# Patient Record
Sex: Female | Born: 1970
Health system: Southern US, Community
[De-identification: ages and names within clinical notes are randomized; demographics above are authoritative.]

## PROBLEM LIST (undated history)

## (undated) DIAGNOSIS — K219 Gastro-esophageal reflux disease without esophagitis: Secondary | ICD-10-CM

## (undated) DIAGNOSIS — M719 Bursopathy, unspecified: Secondary | ICD-10-CM

## (undated) DIAGNOSIS — R51 Headache: Secondary | ICD-10-CM

## (undated) DIAGNOSIS — K589 Irritable bowel syndrome without diarrhea: Secondary | ICD-10-CM

## (undated) DIAGNOSIS — J302 Other seasonal allergic rhinitis: Secondary | ICD-10-CM

## (undated) HISTORY — PX: TUBAL LIGATION: SHX77

## (undated) HISTORY — PX: OTHER SURGICAL HISTORY: SHX169

## (undated) HISTORY — PX: TONSILLECTOMY: SUR1361

---

## 1997-07-20 ENCOUNTER — Ambulatory Visit (HOSPITAL_COMMUNITY): Admission: RE | Admit: 1997-07-20 | Discharge: 1997-07-20 | Payer: Self-pay | Admitting: Obstetrics

## 1998-04-09 ENCOUNTER — Encounter: Admission: RE | Admit: 1998-04-09 | Discharge: 1998-04-09 | Payer: Self-pay | Admitting: *Deleted

## 1998-06-24 ENCOUNTER — Encounter: Payer: Self-pay | Admitting: Obstetrics & Gynecology

## 1998-06-24 ENCOUNTER — Inpatient Hospital Stay (HOSPITAL_COMMUNITY): Admission: AD | Admit: 1998-06-24 | Discharge: 1998-06-24 | Payer: Self-pay | Admitting: Obstetrics & Gynecology

## 1998-06-28 ENCOUNTER — Emergency Department (HOSPITAL_COMMUNITY): Admission: EM | Admit: 1998-06-28 | Discharge: 1998-06-28 | Payer: Self-pay | Admitting: Emergency Medicine

## 1998-09-10 ENCOUNTER — Emergency Department (HOSPITAL_COMMUNITY): Admission: EM | Admit: 1998-09-10 | Discharge: 1998-09-10 | Payer: Self-pay | Admitting: Emergency Medicine

## 1998-10-24 ENCOUNTER — Emergency Department (HOSPITAL_COMMUNITY): Admission: EM | Admit: 1998-10-24 | Discharge: 1998-10-24 | Payer: Self-pay | Admitting: Internal Medicine

## 1998-10-29 ENCOUNTER — Other Ambulatory Visit: Admission: RE | Admit: 1998-10-29 | Discharge: 1998-10-29 | Payer: Self-pay | Admitting: Obstetrics

## 1998-11-26 ENCOUNTER — Emergency Department (HOSPITAL_COMMUNITY): Admission: EM | Admit: 1998-11-26 | Discharge: 1998-11-27 | Payer: Self-pay | Admitting: *Deleted

## 1998-11-30 ENCOUNTER — Ambulatory Visit (HOSPITAL_COMMUNITY): Admission: RE | Admit: 1998-11-30 | Discharge: 1998-11-30 | Payer: Self-pay | Admitting: *Deleted

## 1999-01-18 ENCOUNTER — Inpatient Hospital Stay (HOSPITAL_COMMUNITY): Admission: AD | Admit: 1999-01-18 | Discharge: 1999-01-18 | Payer: Self-pay | Admitting: Obstetrics

## 1999-02-05 ENCOUNTER — Encounter: Payer: Self-pay | Admitting: Emergency Medicine

## 1999-02-05 ENCOUNTER — Emergency Department (HOSPITAL_COMMUNITY): Admission: EM | Admit: 1999-02-05 | Discharge: 1999-02-05 | Payer: Self-pay | Admitting: Emergency Medicine

## 1999-03-08 ENCOUNTER — Inpatient Hospital Stay (HOSPITAL_COMMUNITY): Admission: AD | Admit: 1999-03-08 | Discharge: 1999-03-08 | Payer: Self-pay | Admitting: Obstetrics

## 1999-03-09 ENCOUNTER — Emergency Department (HOSPITAL_COMMUNITY): Admission: EM | Admit: 1999-03-09 | Discharge: 1999-03-09 | Payer: Self-pay

## 1999-04-24 ENCOUNTER — Ambulatory Visit (HOSPITAL_COMMUNITY): Admission: RE | Admit: 1999-04-24 | Discharge: 1999-04-24 | Payer: Self-pay | Admitting: Neurology

## 1999-04-24 ENCOUNTER — Encounter: Payer: Self-pay | Admitting: Neurology

## 1999-08-18 ENCOUNTER — Inpatient Hospital Stay (HOSPITAL_COMMUNITY): Admission: AD | Admit: 1999-08-18 | Discharge: 1999-08-18 | Payer: Self-pay | Admitting: Obstetrics

## 1999-10-11 ENCOUNTER — Inpatient Hospital Stay (HOSPITAL_COMMUNITY): Admission: AD | Admit: 1999-10-11 | Discharge: 1999-10-11 | Payer: Self-pay | Admitting: Obstetrics

## 2000-01-09 ENCOUNTER — Inpatient Hospital Stay (HOSPITAL_COMMUNITY): Admission: AD | Admit: 2000-01-09 | Discharge: 2000-01-12 | Payer: Self-pay | Admitting: Obstetrics

## 2000-01-18 ENCOUNTER — Inpatient Hospital Stay (HOSPITAL_COMMUNITY): Admission: AD | Admit: 2000-01-18 | Discharge: 2000-01-18 | Payer: Self-pay

## 2000-08-11 ENCOUNTER — Other Ambulatory Visit: Admission: RE | Admit: 2000-08-11 | Discharge: 2000-08-11 | Payer: Self-pay | Admitting: Obstetrics

## 2000-08-19 ENCOUNTER — Emergency Department (HOSPITAL_COMMUNITY): Admission: EM | Admit: 2000-08-19 | Discharge: 2000-08-19 | Payer: Self-pay | Admitting: *Deleted

## 2001-10-14 ENCOUNTER — Ambulatory Visit (HOSPITAL_COMMUNITY): Admission: RE | Admit: 2001-10-14 | Discharge: 2001-10-14 | Payer: Self-pay | Admitting: Obstetrics

## 2001-10-14 ENCOUNTER — Encounter: Payer: Self-pay | Admitting: Obstetrics

## 2001-10-28 ENCOUNTER — Inpatient Hospital Stay (HOSPITAL_COMMUNITY): Admission: AD | Admit: 2001-10-28 | Discharge: 2001-10-28 | Payer: Self-pay | Admitting: Obstetrics

## 2001-11-09 ENCOUNTER — Inpatient Hospital Stay (HOSPITAL_COMMUNITY): Admission: AD | Admit: 2001-11-09 | Discharge: 2001-11-09 | Payer: Self-pay | Admitting: Obstetrics

## 2001-11-16 ENCOUNTER — Ambulatory Visit (HOSPITAL_COMMUNITY): Admission: RE | Admit: 2001-11-16 | Discharge: 2001-11-16 | Payer: Self-pay | Admitting: Obstetrics

## 2001-11-16 ENCOUNTER — Encounter: Payer: Self-pay | Admitting: Obstetrics

## 2001-11-18 ENCOUNTER — Inpatient Hospital Stay (HOSPITAL_COMMUNITY): Admission: AD | Admit: 2001-11-18 | Discharge: 2001-11-20 | Payer: Self-pay | Admitting: Obstetrics

## 2001-11-21 ENCOUNTER — Inpatient Hospital Stay (HOSPITAL_COMMUNITY): Admission: AD | Admit: 2001-11-21 | Discharge: 2001-11-21 | Payer: Self-pay | Admitting: Obstetrics

## 2001-11-22 ENCOUNTER — Inpatient Hospital Stay (HOSPITAL_COMMUNITY): Admission: AD | Admit: 2001-11-22 | Discharge: 2001-11-22 | Payer: Self-pay | Admitting: Obstetrics

## 2001-11-24 ENCOUNTER — Inpatient Hospital Stay (HOSPITAL_COMMUNITY): Admission: AD | Admit: 2001-11-24 | Discharge: 2001-11-24 | Payer: Self-pay | Admitting: Obstetrics

## 2001-11-28 ENCOUNTER — Inpatient Hospital Stay (HOSPITAL_COMMUNITY): Admission: AD | Admit: 2001-11-28 | Discharge: 2001-11-30 | Payer: Self-pay | Admitting: Obstetrics

## 2001-12-03 ENCOUNTER — Inpatient Hospital Stay (HOSPITAL_COMMUNITY): Admission: AD | Admit: 2001-12-03 | Discharge: 2001-12-05 | Payer: Self-pay | Admitting: Obstetrics

## 2002-01-12 ENCOUNTER — Inpatient Hospital Stay (HOSPITAL_COMMUNITY): Admission: RE | Admit: 2002-01-12 | Discharge: 2002-01-17 | Payer: Self-pay | Admitting: Obstetrics

## 2002-01-12 ENCOUNTER — Encounter: Payer: Self-pay | Admitting: Obstetrics

## 2002-01-12 ENCOUNTER — Encounter (INDEPENDENT_AMBULATORY_CARE_PROVIDER_SITE_OTHER): Payer: Self-pay | Admitting: *Deleted

## 2002-04-14 ENCOUNTER — Emergency Department (HOSPITAL_COMMUNITY): Admission: EM | Admit: 2002-04-14 | Discharge: 2002-04-14 | Payer: Self-pay | Admitting: Emergency Medicine

## 2002-04-14 ENCOUNTER — Encounter: Payer: Self-pay | Admitting: Emergency Medicine

## 2003-12-20 ENCOUNTER — Inpatient Hospital Stay (HOSPITAL_COMMUNITY): Admission: AD | Admit: 2003-12-20 | Discharge: 2003-12-20 | Payer: Self-pay | Admitting: Obstetrics

## 2003-12-22 ENCOUNTER — Inpatient Hospital Stay (HOSPITAL_COMMUNITY): Admission: AD | Admit: 2003-12-22 | Discharge: 2003-12-22 | Payer: Self-pay | Admitting: Obstetrics

## 2004-06-24 ENCOUNTER — Inpatient Hospital Stay (HOSPITAL_COMMUNITY): Admission: AD | Admit: 2004-06-24 | Discharge: 2004-06-24 | Payer: Self-pay | Admitting: Obstetrics

## 2004-07-05 ENCOUNTER — Inpatient Hospital Stay (HOSPITAL_COMMUNITY): Admission: AD | Admit: 2004-07-05 | Discharge: 2004-07-05 | Payer: Self-pay | Admitting: Obstetrics

## 2004-07-08 ENCOUNTER — Inpatient Hospital Stay (HOSPITAL_COMMUNITY): Admission: AD | Admit: 2004-07-08 | Discharge: 2004-07-08 | Payer: Self-pay | Admitting: Obstetrics

## 2004-07-13 ENCOUNTER — Inpatient Hospital Stay (HOSPITAL_COMMUNITY): Admission: AD | Admit: 2004-07-13 | Discharge: 2004-07-13 | Payer: Self-pay | Admitting: Obstetrics

## 2004-07-15 ENCOUNTER — Encounter (HOSPITAL_COMMUNITY): Admission: RE | Admit: 2004-07-15 | Discharge: 2004-07-15 | Payer: Self-pay | Admitting: Obstetrics

## 2004-07-22 ENCOUNTER — Inpatient Hospital Stay (HOSPITAL_COMMUNITY): Admission: AD | Admit: 2004-07-22 | Discharge: 2004-08-10 | Payer: Self-pay | Admitting: Obstetrics

## 2004-08-13 ENCOUNTER — Inpatient Hospital Stay (HOSPITAL_COMMUNITY): Admission: AD | Admit: 2004-08-13 | Discharge: 2004-08-13 | Payer: Self-pay | Admitting: Obstetrics

## 2004-09-10 ENCOUNTER — Inpatient Hospital Stay (HOSPITAL_COMMUNITY): Admission: AD | Admit: 2004-09-10 | Discharge: 2004-09-10 | Payer: Self-pay | Admitting: Obstetrics

## 2004-09-27 ENCOUNTER — Inpatient Hospital Stay (HOSPITAL_COMMUNITY): Admission: AD | Admit: 2004-09-27 | Discharge: 2004-09-29 | Payer: Self-pay | Admitting: Obstetrics

## 2004-10-07 ENCOUNTER — Encounter (INDEPENDENT_AMBULATORY_CARE_PROVIDER_SITE_OTHER): Payer: Self-pay | Admitting: *Deleted

## 2004-10-07 ENCOUNTER — Inpatient Hospital Stay (HOSPITAL_COMMUNITY): Admission: AD | Admit: 2004-10-07 | Discharge: 2004-10-10 | Payer: Self-pay | Admitting: Obstetrics

## 2004-10-18 ENCOUNTER — Inpatient Hospital Stay (HOSPITAL_COMMUNITY): Admission: AD | Admit: 2004-10-18 | Discharge: 2004-10-18 | Payer: Self-pay | Admitting: Obstetrics

## 2004-12-20 ENCOUNTER — Inpatient Hospital Stay (HOSPITAL_COMMUNITY): Admission: AD | Admit: 2004-12-20 | Discharge: 2004-12-20 | Payer: Self-pay | Admitting: Obstetrics

## 2006-07-24 ENCOUNTER — Emergency Department (HOSPITAL_COMMUNITY): Admission: EM | Admit: 2006-07-24 | Discharge: 2006-07-24 | Payer: Self-pay | Admitting: Emergency Medicine

## 2006-09-24 ENCOUNTER — Ambulatory Visit (HOSPITAL_COMMUNITY): Admission: RE | Admit: 2006-09-24 | Discharge: 2006-09-24 | Payer: Self-pay | Admitting: Obstetrics

## 2007-03-19 ENCOUNTER — Encounter (INDEPENDENT_AMBULATORY_CARE_PROVIDER_SITE_OTHER): Payer: Self-pay | Admitting: Obstetrics

## 2007-03-19 ENCOUNTER — Ambulatory Visit (HOSPITAL_COMMUNITY): Admission: RE | Admit: 2007-03-19 | Discharge: 2007-03-19 | Payer: Self-pay | Admitting: Obstetrics

## 2007-12-08 ENCOUNTER — Emergency Department (HOSPITAL_COMMUNITY): Admission: EM | Admit: 2007-12-08 | Discharge: 2007-12-08 | Payer: Self-pay | Admitting: Emergency Medicine

## 2008-02-10 ENCOUNTER — Emergency Department (HOSPITAL_COMMUNITY): Admission: EM | Admit: 2008-02-10 | Discharge: 2008-02-10 | Payer: Self-pay | Admitting: Family Medicine

## 2008-03-14 ENCOUNTER — Emergency Department (HOSPITAL_COMMUNITY): Admission: EM | Admit: 2008-03-14 | Discharge: 2008-03-14 | Payer: Self-pay | Admitting: Family Medicine

## 2008-11-19 ENCOUNTER — Emergency Department (HOSPITAL_COMMUNITY): Admission: EM | Admit: 2008-11-19 | Discharge: 2008-11-20 | Payer: Self-pay | Admitting: Emergency Medicine

## 2008-12-15 ENCOUNTER — Emergency Department (HOSPITAL_COMMUNITY): Admission: EM | Admit: 2008-12-15 | Discharge: 2008-12-15 | Payer: Self-pay | Admitting: Emergency Medicine

## 2009-05-16 ENCOUNTER — Emergency Department (HOSPITAL_COMMUNITY): Admission: EM | Admit: 2009-05-16 | Discharge: 2009-05-16 | Payer: Self-pay | Admitting: Emergency Medicine

## 2009-06-16 ENCOUNTER — Emergency Department (HOSPITAL_COMMUNITY): Admission: EM | Admit: 2009-06-16 | Discharge: 2009-06-16 | Payer: Self-pay | Admitting: Family Medicine

## 2009-06-19 ENCOUNTER — Emergency Department (HOSPITAL_COMMUNITY): Admission: EM | Admit: 2009-06-19 | Discharge: 2009-06-19 | Payer: Self-pay | Admitting: Emergency Medicine

## 2009-07-02 ENCOUNTER — Emergency Department (HOSPITAL_COMMUNITY): Admission: EM | Admit: 2009-07-02 | Discharge: 2009-07-02 | Payer: Self-pay | Admitting: Emergency Medicine

## 2009-07-16 ENCOUNTER — Emergency Department (HOSPITAL_COMMUNITY): Admission: EM | Admit: 2009-07-16 | Discharge: 2009-07-16 | Payer: Self-pay | Admitting: Emergency Medicine

## 2009-09-09 ENCOUNTER — Emergency Department (HOSPITAL_COMMUNITY): Admission: EM | Admit: 2009-09-09 | Discharge: 2009-09-09 | Payer: Self-pay | Admitting: Emergency Medicine

## 2009-10-19 ENCOUNTER — Emergency Department (HOSPITAL_COMMUNITY): Admission: EM | Admit: 2009-10-19 | Discharge: 2009-10-19 | Payer: Self-pay | Admitting: Emergency Medicine

## 2009-11-09 ENCOUNTER — Ambulatory Visit (HOSPITAL_COMMUNITY): Admission: RE | Admit: 2009-11-09 | Discharge: 2009-11-09 | Payer: Self-pay | Admitting: Obstetrics

## 2009-12-06 ENCOUNTER — Ambulatory Visit (HOSPITAL_BASED_OUTPATIENT_CLINIC_OR_DEPARTMENT_OTHER): Admission: RE | Admit: 2009-12-06 | Discharge: 2009-12-06 | Payer: Self-pay | Admitting: Urology

## 2010-05-26 ENCOUNTER — Encounter: Payer: Self-pay | Admitting: Obstetrics

## 2010-07-19 LAB — POCT PREGNANCY, URINE: Preg Test, Ur: NEGATIVE

## 2010-07-19 LAB — POCT HEMOGLOBIN-HEMACUE: Hemoglobin: 13.1 g/dL (ref 12.0–15.0)

## 2010-07-21 LAB — URINALYSIS, ROUTINE W REFLEX MICROSCOPIC
Bilirubin Urine: NEGATIVE
Glucose, UA: NEGATIVE mg/dL
Ketones, ur: NEGATIVE mg/dL
Leukocytes, UA: NEGATIVE
Nitrite: NEGATIVE
Protein, ur: NEGATIVE mg/dL
Specific Gravity, Urine: 1.019 (ref 1.005–1.030)
Urobilinogen, UA: 0.2 mg/dL (ref 0.0–1.0)
pH: 5 (ref 5.0–8.0)

## 2010-07-21 LAB — URINE MICROSCOPIC-ADD ON

## 2010-07-21 LAB — POCT I-STAT, CHEM 8
BUN: 6 mg/dL (ref 6–23)
Calcium, Ion: 1.17 mmol/L (ref 1.12–1.32)
Chloride: 107 mEq/L (ref 96–112)
Creatinine, Ser: 1 mg/dL (ref 0.4–1.2)
Glucose, Bld: 95 mg/dL (ref 70–99)
HCT: 39 % (ref 36.0–46.0)
Hemoglobin: 13.3 g/dL (ref 12.0–15.0)
Potassium: 3.6 mEq/L (ref 3.5–5.1)
Sodium: 138 mEq/L (ref 135–145)
TCO2: 22 mmol/L (ref 0–100)

## 2010-07-21 LAB — DIFFERENTIAL
Basophils Absolute: 0 10*3/uL (ref 0.0–0.1)
Basophils Relative: 1 % (ref 0–1)
Eosinophils Absolute: 0.3 10*3/uL (ref 0.0–0.7)
Eosinophils Relative: 6 % — ABNORMAL HIGH (ref 0–5)
Lymphocytes Relative: 39 % (ref 12–46)
Lymphs Abs: 1.6 10*3/uL (ref 0.7–4.0)
Monocytes Absolute: 0.5 10*3/uL (ref 0.1–1.0)
Monocytes Relative: 11 % (ref 3–12)
Neutro Abs: 1.8 10*3/uL (ref 1.7–7.7)
Neutrophils Relative %: 43 % (ref 43–77)

## 2010-07-21 LAB — CBC
HCT: 38.6 % (ref 36.0–46.0)
Hemoglobin: 12.9 g/dL (ref 12.0–15.0)
MCHC: 33.4 g/dL (ref 30.0–36.0)
MCV: 94.4 fL (ref 78.0–100.0)
Platelets: 268 10*3/uL (ref 150–400)
RBC: 4.09 MIL/uL (ref 3.87–5.11)
RDW: 12.9 % (ref 11.5–15.5)
WBC: 4.2 10*3/uL (ref 4.0–10.5)

## 2010-07-21 LAB — WET PREP, GENITAL
Trich, Wet Prep: NONE SEEN
Yeast Wet Prep HPF POC: NONE SEEN

## 2010-07-21 LAB — RAPID STREP SCREEN (MED CTR MEBANE ONLY): Streptococcus, Group A Screen (Direct): NEGATIVE

## 2010-07-21 LAB — POCT PREGNANCY, URINE: Preg Test, Ur: NEGATIVE

## 2010-07-23 LAB — POCT RAPID STREP A (OFFICE): Streptococcus, Group A Screen (Direct): POSITIVE — AB

## 2010-07-24 LAB — CBC
HCT: 38.1 % (ref 36.0–46.0)
Hemoglobin: 13.2 g/dL (ref 12.0–15.0)
MCHC: 34.6 g/dL (ref 30.0–36.0)
MCV: 93.7 fL (ref 78.0–100.0)
Platelets: 228 10*3/uL (ref 150–400)
RBC: 4.06 MIL/uL (ref 3.87–5.11)
RDW: 13.6 % (ref 11.5–15.5)
WBC: 4.5 10*3/uL (ref 4.0–10.5)

## 2010-07-24 LAB — D-DIMER, QUANTITATIVE: D-Dimer, Quant: 0.31 ug/mL-FEU (ref 0.00–0.48)

## 2010-07-24 LAB — COMPREHENSIVE METABOLIC PANEL
ALT: 21 U/L (ref 0–35)
AST: 17 U/L (ref 0–37)
Albumin: 4 g/dL (ref 3.5–5.2)
Alkaline Phosphatase: 62 U/L (ref 39–117)
BUN: 8 mg/dL (ref 6–23)
CO2: 24 mEq/L (ref 19–32)
Calcium: 9.6 mg/dL (ref 8.4–10.5)
Chloride: 108 mEq/L (ref 96–112)
Creatinine, Ser: 0.83 mg/dL (ref 0.4–1.2)
GFR calc Af Amer: >60 mL/min (ref >60)
GFR calc non Af Amer: >60 mL/min (ref >60)
Glucose, Bld: 91 mg/dL (ref 70–99)
Potassium: 3.9 mEq/L (ref 3.5–5.1)
Sodium: 137 mEq/L (ref 135–145)
Total Bilirubin: 0.3 mg/dL (ref 0.3–1.2)
Total Protein: 7.6 g/dL (ref 6.0–8.3)

## 2010-07-24 LAB — DIFFERENTIAL
Basophils Absolute: 0 10*3/uL (ref 0.0–0.1)
Basophils Relative: 1 % (ref 0–1)
Eosinophils Absolute: 0.3 10*3/uL (ref 0.0–0.7)
Eosinophils Relative: 6 % — ABNORMAL HIGH (ref 0–5)
Lymphocytes Relative: 45 % (ref 12–46)
Lymphs Abs: 2 10*3/uL (ref 0.7–4.0)
Monocytes Absolute: 0.5 10*3/uL (ref 0.1–1.0)
Monocytes Relative: 11 % (ref 3–12)
Neutro Abs: 1.6 10*3/uL — ABNORMAL LOW (ref 1.7–7.7)
Neutrophils Relative %: 36 % — ABNORMAL LOW (ref 43–77)

## 2010-07-24 LAB — POCT CARDIAC MARKERS
CKMB, poc: <1 ng/mL — ABNORMAL LOW (ref 1.0–8.0)
Myoglobin, poc: 63.5 ng/mL (ref 12–200)
Troponin i, poc: <0.05 ng/mL (ref 0.00–0.09)

## 2010-07-24 LAB — URINALYSIS, ROUTINE W REFLEX MICROSCOPIC
Bilirubin Urine: NEGATIVE
Glucose, UA: NEGATIVE mg/dL
Ketones, ur: NEGATIVE mg/dL
Nitrite: POSITIVE — AB
Protein, ur: 100 mg/dL — AB
Specific Gravity, Urine: 1.033 — ABNORMAL HIGH (ref 1.005–1.030)
Urobilinogen, UA: 0.2 mg/dL (ref 0.0–1.0)
pH: 6 (ref 5.0–8.0)

## 2010-07-24 LAB — URINE CULTURE: Colony Count: >=100000

## 2010-07-24 LAB — URINE MICROSCOPIC-ADD ON

## 2010-07-24 LAB — POCT PREGNANCY, URINE: Preg Test, Ur: NEGATIVE

## 2010-07-24 LAB — LIPASE, BLOOD: Lipase: 38 U/L (ref 11–59)

## 2010-07-28 LAB — POCT RAPID STREP A (OFFICE): Streptococcus, Group A Screen (Direct): POSITIVE — AB

## 2010-08-11 LAB — PREGNANCY, URINE: Preg Test, Ur: NEGATIVE

## 2010-08-11 LAB — COMPREHENSIVE METABOLIC PANEL
ALT: 18 U/L (ref 0–35)
AST: 21 U/L (ref 0–37)
Albumin: 4.1 g/dL (ref 3.5–5.2)
Alkaline Phosphatase: 60 U/L (ref 39–117)
BUN: 12 mg/dL (ref 6–23)
CO2: 21 mEq/L (ref 19–32)
Calcium: 9.9 mg/dL (ref 8.4–10.5)
Chloride: 108 mEq/L (ref 96–112)
Creatinine, Ser: 0.99 mg/dL (ref 0.4–1.2)
GFR calc Af Amer: >60 mL/min (ref >60)
GFR calc non Af Amer: >60 mL/min (ref >60)
Glucose, Bld: 95 mg/dL (ref 70–99)
Potassium: 3.4 mEq/L — ABNORMAL LOW (ref 3.5–5.1)
Sodium: 136 mEq/L (ref 135–145)
Total Bilirubin: 0.7 mg/dL (ref 0.3–1.2)
Total Protein: 7.9 g/dL (ref 6.0–8.3)

## 2010-08-11 LAB — CBC
HCT: 39.1 % (ref 36.0–46.0)
Hemoglobin: 13.5 g/dL (ref 12.0–15.0)
MCHC: 34.4 g/dL (ref 30.0–36.0)
MCV: 94.4 fL (ref 78.0–100.0)
Platelets: 265 10*3/uL (ref 150–400)
RBC: 4.14 MIL/uL (ref 3.87–5.11)
RDW: 12.5 % (ref 11.5–15.5)
WBC: 5.7 10*3/uL (ref 4.0–10.5)

## 2010-08-11 LAB — URINALYSIS, ROUTINE W REFLEX MICROSCOPIC
Bilirubin Urine: NEGATIVE
Glucose, UA: NEGATIVE mg/dL
Hgb urine dipstick: NEGATIVE
Ketones, ur: NEGATIVE mg/dL
Nitrite: NEGATIVE
Protein, ur: NEGATIVE mg/dL
Specific Gravity, Urine: 1.018 (ref 1.005–1.030)
Urobilinogen, UA: 1 mg/dL (ref 0.0–1.0)
pH: 6.5 (ref 5.0–8.0)

## 2010-08-11 LAB — DIFFERENTIAL
Basophils Absolute: 0.1 10*3/uL (ref 0.0–0.1)
Basophils Relative: 2 % — ABNORMAL HIGH (ref 0–1)
Eosinophils Absolute: 0.2 10*3/uL (ref 0.0–0.7)
Eosinophils Relative: 3 % (ref 0–5)
Lymphocytes Relative: 47 % — ABNORMAL HIGH (ref 12–46)
Lymphs Abs: 2.6 10*3/uL (ref 0.7–4.0)
Monocytes Absolute: 0.5 10*3/uL (ref 0.1–1.0)
Monocytes Relative: 9 % (ref 3–12)
Neutro Abs: 2.2 10*3/uL (ref 1.7–7.7)
Neutrophils Relative %: 39 % — ABNORMAL LOW (ref 43–77)

## 2010-09-17 NOTE — Op Note (Signed)
Samantha, Cuevas               ACCOUNT NO.:  [id number]   MEDICAL RECORD NO.:  [id number]          PATIENT TYPE:  AMB   LOCATION:  SDC                           [facility]  [facility]   PHYSICIAN:  Kathreen Cosier, M.D.DATE OF BIRTH:  25-Aug-1970   DATE OF PROCEDURE:  03/19/2007  DATE OF DISCHARGE:                               OPERATIVE REPORT   PREOPERATIVE DIAGNOSIS:  Dysfunctional uterine bleeding.   POSTOPERATIVE DIAGNOSIS:  Dysfunctional uterine bleeding.   ANESTHESIA:  General.   DESCRIPTION OF PROCEDURE:  Under general anesthesia with the patient in  the lithotomy position, the peritoneum and vagina were prepped and  draped.  The bladder was emptied with a straight catheter.  Bimanual  examination revealed the uterus top normal size.  Speculum was placed in  the vagina.  The cervix was injected with 10 mL of 1% lidocaine.  The  endocervix curetted and a small amount of tissue obtained.  Endometrial  cavity sounded to 9 cm.  Cervical length measured at 5 cm.  Cavity  length was 4 cm.  The cervix was dilated to #25 Shawnie Pons and the  hysteroscope was inserted.  The cavity was noted to be normal.  The  hysteroscope was removed and the sharp curettage done.  A small amount  of tissue obtained.  The NovaSure device was then inserted.  The cavity  width was 4.5 cm.  Cavity integrity passed and ablation performed for 2  minutes at 99 watts.  Fluid loss was 80 mL.  A repeat hysteroscopy done  showed total fluid deficit was 80 mL.  The patient tolerated the  procedure well and was taken to the recovery room in good condition.           ______________________________  Kathreen Cosier, M.D.     BAM/MEDQ  D:  03/19/2007  T:  03/20/2007  Job:  [id number]

## 2010-09-20 NOTE — Discharge Summary (Signed)
NAMEANTONAE, ZBIKOWSKI NO.:  [id number]   MEDICAL RECORD NO.:  [id number]          PATIENT TYPE:  INP   LOCATION:  9157                          [facility]   PHYSICIAN:  Kathreen Cosier, M.D.DATE OF BIRTH:  July 07, 1970   DATE OF ADMISSION:  07/22/2004  DATE OF DISCHARGE:  08/10/2004                                 DISCHARGE SUMMARY   HOSPITAL COURSE:  The patient is a 40 year old gravida 7 para 2-2-3-4, had  three C-sections in the past. Her EDC was October 27, 2004. She was admitted  with premature contractions and then started on Delalutin with this  pregnancy. On the day of admission, the cervix was shown by ultrasound to be  0.9 cm long. She had had irritability, and she was admitted for  betamethasone and magnesium sulfate. The patient was kept on magnesium  sulfate throughout her stay and she was on 2.5 g of magnesium per hour,  sometimes had to be upped to 3 g of magnesium sulfate per hour. GBS was  performed and that was negative. On April 1 she was started on terbutaline  2.5 p.o. q.4h. and the contractions restarted, so she was restarted on  magnesium sulfate. Eventually she was weaned off of magnesium sulfate and  started on Procardia 60 mg daily and she subsequently stopped contracting.  Hemoglobin was 10.9, white count 7.5, platelets 248. Urine was negative. She  was discharged home on April 8 on Procardia 60 mg daily to see me in 2  weeks.   DISCHARGE DIAGNOSES:  Status post admission for premature contractions.      BAM/MEDQ  D:  10/02/2004  T:  10/02/2004  Job:  [id number]

## 2010-09-20 NOTE — Discharge Summary (Signed)
NAMEETHELL, BLATCHFORD NO.:  [id number]   MEDICAL RECORD NO.:  [id number]          PATIENT TYPE:  INP   LOCATION:  9156                          [facility]   PHYSICIAN:  Kathreen Cosier, M.D.DATE OF BIRTH:  09-08-1970   DATE OF ADMISSION:  09/27/2004  DATE OF DISCHARGE:  09/29/2004                                 DISCHARGE SUMMARY   The patient is a 40 year old gravida para 2-2-3-4, had three previous C-  sections in the past.  [facility] October 27, 2004.  This was her third  hospitalization with premature contractions, and she was placed on magnesium  sulfate. The patient stopped contracting, and by Sep 29, 2004 her magnesium  was discontinued and she was discharged on bed rest.   DISCHARGE DIAGNOSIS:  Status post hospitalization for premature  contractions.       BAM/MEDQ  D:  10/16/2004  T:  10/16/2004  Job:  [id number]

## 2010-09-20 NOTE — Op Note (Signed)
   NAME:  [name], [name]                         ACCOUNT NO.:  [id number]   MEDICAL RECORD NO.:  [id number]                   PATIENT TYPE:  INP   LOCATION:  9124                                 [facility]  [facility]   PHYSICIAN:  Kathreen Cosier, M.D.           DATE OF BIRTH:  02-15-1971   DATE OF PROCEDURE:  DATE OF DISCHARGE:                                 OPERATIVE REPORT   ANESTHESIA:  Spinal.   SURGEON:  Kathreen Cosier, M.D.   FIRST ASSISTANT:  Enid Cutter, M.D.   PROCEDURE:  The patient placed on the operating table in supine position  after general anesthesia administered.  Abdomen prepped and draped.  Bladder  emptied with Foley catheter.  Transverse suprapubic incision made, carried  down to the rectus fascia.  Fascia cleaned and incised the length of the  incision.  Recti muscles retracted laterally.  Peritoneum incised  longitudinally.  Transverse incision made in the visceroperitoneum above the  bladder and bladder mobilized inferiorly.  Transverse lower uterine incision  made.  There were two separate sacs.  Twin A the fluid was clear, double  footling breech, female.  Apgar 5, 3, and 8.  Baby weighed 4 pounds 6.7 ounces  and was a female.  Twin B was transverse lie and converted to breech and  double footling extraction done at 2345.  Apgar was 6 and 9.  Female weighing  4 pounds 0.5 ounces.  Fluid was clear.  Placenta was posterior, removed  manually, and sent to pathology.  Uterine laps cleaned with dry laps.  Uterine incision closed in one layer with continuous suture of number 1  chromic.  Hemostasis satisfactory.  Bladder flap reattached with 2-0  chromic.  Uterus well contracted.  Tubes and ovaries normal.  Abdomen closed  in layers.  Peritoneum continuous suture of 0 chromic and the fascia closed  with continuous suture of 0 Dexon.  Skin closed with subcuticular stitch of  0 plain.  Blood loss 500 cc.  The patient tolerated procedure well.  Taken  to recovery  room in good condition.                                               Kathreen Cosier, M.D.    BAM/MEDQ  D:  01/14/2002  T:  01/14/2002  Job:  [id number]

## 2010-09-20 NOTE — Discharge Summary (Signed)
   NAMESIOMARA, [name]                         ACCOUNT NO.:  [id number]   MEDICAL RECORD NO.:  [id number]                   PATIENT TYPE:  INP   LOCATION:  9156                                 [facility]   PHYSICIAN:  Kathreen Cosier, M.D.           DATE OF BIRTH:  25-Oct-1970   DATE OF ADMISSION:  12/03/2001  DATE OF DISCHARGE:  12/05/2001                                 DISCHARGE SUMMARY   SUMMARY:  The patient is a 40 year old gravida 5, para 2-0-2-2, pregnant  with twins, 26 weeks by dates and ultrasound.  She had frequent  hospitalizations with uterine irritability where she has been on magnesium  sulfate and then discharged on terbutaline.   She was admitted on 12/03/01 due to uterine irritability.  She was started  on magnesium sulfate 4-gram loading 2 grams an hour.  After the irritability  subsided, she was placed on terbutaline 2.5 q.4h. The patient discharged on  12/05/01 to see me in two weeks.   DISCHARGE DIAGNOSIS:  Status post hospitalization for uterine irritability.                                               Kathreen Cosier, M.D.    BAM/MEDQ  D:  12/29/2001  T:  12/30/2001  Job:  [id number]

## 2010-09-20 NOTE — Op Note (Signed)
[name], STEHR NO.:  [id number]   MEDICAL RECORD NO.:  [id number]          PATIENT TYPE:  INP   LOCATION:  9117                          [facility]   PHYSICIAN:  Kathreen Cosier, M.D.DATE OF BIRTH:  1971-04-20   DATE OF PROCEDURE:  10/07/2004  DATE OF DISCHARGE:  10/10/2004                                 OPERATIVE REPORT   REDICTATION:   PREOPERATIVE DIAGNOSES:  Previous cesarean section at term, multiparity,  desires repeat.   POSTOPERATIVE DIAGNOSES:  Previous cesarean section at term, multiparity,  desires repeat.   SURGEON:  Kathreen Cosier, M.D.   FIRST ASSISTANT:  Charles A. Clearance Coots, M.D.   DESCRIPTION OF PROCEDURE:  The patient placed on the operating table in  supine position after the spinal administered. Abdomen prepped and draped,  bladder emptied with a Foley catheter. A transverse suprapubic incision made  and carried down to the rectus fascia, the fascia cleaned and incised the  length of the incision. Recti muscles retracted laterally, peritoneum  incised longitudinally. A transverse incision made in the visceroperitoneum  above the bladder, bladder mobilized inferiorly. A transverse lower uterine  incision made, the patient delivered a female, Apgar 8 and 9 weighing 7 pounds  1 ounce from the OP position. The placenta was anterior, removed manually.  The uterine cavity cleaned with dry laps. The uterine incision closed in one  layer with continuous suture of #1 chromic. Hemostasis is satisfactory.  Bladder flap reattached with 2-0 chromic. The uterus was well contracted,  tubes and ovaries normal. The right tube grasped in the mid portion with a  Babcock clamp. #0 plain suture placed in the mesosalpinx below the portion  of tube in the clamp. This was tied and approximately 1 inch of tube  transected. The procedure was done in a similar fashion on the other side.  Lap and sponge counts were correct. The abdomen closed in  layers, peritoneum  continuous sutures of #0 chromic, fascia continuous suture of #0 Dexon. The  skin closed with subcuticular stitch of 4-0 Monocryl. Blood loss 800 mL.       BAM/MEDQ  D:  10/30/2004  T:  10/30/2004  Job:  [id number]

## 2010-09-20 NOTE — H&P (Signed)
   [name], [name]                         ACCOUNT NO.:  [id number]   MEDICAL RECORD NO.:  [id number]                   PATIENT TYPE:  INP   LOCATION:  9124                                 [facility]   PHYSICIAN:  Kathreen Cosier, M.D.           DATE OF BIRTH:  [birth year]   DATE OF ADMISSION:  01/12/2002  DATE OF DISCHARGE:                                HISTORY & PHYSICAL   HISTORY OF PRESENT ILLNESS:  The patient is a 40 year old gravida 5, para 2-  0-2-2, [facility] March 08, 2002, pregnant with twins, had two previous C-  sections.  The patient on September 10th came in for a routine followup  ultrasound which showed normal growth 32 weeks size with weight at 1900  grams each, but the cervix was short 1.2 cm and possibly open and the cord  was prolapsed down to the cervix, and she had had many previous admissions  for premature contractions and had been on terbutaline 2.5 mg p.o. q.4 h. at  home.  The patient was admitted and was on bed rest, and the babies were  monitored q. shift.  The patient started contracting on morning of September  11th and restarted on her terbutaline; however, the contractions continued  and she was restarted on magnesium sulfate 4-gram loading, 2 grams per hour,  and she then had more than 5 contractions per hour.  She received 3 grams  per hour.  By 10 p.m. on September 11th, the patient was contracting every 5  minutes regular and on examination her cervix which was 1 cm was about 3 cm.  Twin A was breech and twin B vertex.  The patient received an additional  dose of terbutaline; however, her contractions continued and it was decided  she would deliver by a repeat C-section because of 32+ week twin gestation  and labor.   PHYSICAL EXAMINATION:  GENERAL:  A well-developed female in early labor.  HEENT:  Negative.  LUNGS:  Clear.  HEART:  Regular rhythm, no murmurs, no gallops.  ABDOMEN:  Enlarged and distended with twin gestation.  PELVIC:   Cervix 3 cm, 60%.  EXTREMITIES:  Negative.                                               Kathreen Cosier, M.D.    BAM/MEDQ  D:  01/14/2002  T:  01/14/2002  Job:  [id number]

## 2010-09-20 NOTE — Op Note (Signed)
[facility]  Patient:    [name], [name]                      MRN: [id number] Proc. Date: 01/09/00 Adm. Date:  [id number] Attending:  Venita Sheffield                           Operative Report  PREOPERATIVE DIAGNOSIS:       Previous cesarean section at term and                               desires repeat.  POSTOPERATIVE DIAGNOSIS:      Previous cesarean section at term and                               desires repeat.  OPERATION:                    Repeat cesarean section.  SURGEON:                      Kathreen Cosier, M.D.  ASSISTANT:  ANESTHESIA:                   Spinal anesthesia.  DESCRIPTION OF PROCEDURE:     The patient was placed on the operating table in the supine position after spinal anesthesia administered.  Abdomen was prepped and draped. Bladder emptied with a Foley catheter.  Transverse suprapubic incision was made through the old scar and carried down to the rectus fascia. The fascia was cleanly incised the length of the incision.  Recti muscles were retracted laterally.  Peritoneum was entered and a transverse incision was made in the viscera peritoneum above the bladder and the bladder mobilized inferiorly.  Transverse lower uterine incision made.  The patient delivered from the ROA position of a female with Apgars 8 and 9, weighing 7 pounds 15 ounces. The fluid was clear.  The team was in attendance.  Placenta was anterior and removed manually.  Uterine cavity was cleaned with dry laps.  The uterine incision was closed with interlocking sutures of #1 chromic including myometrium and endometrium. The uterus was closed in one layer.  Hemostasis was satisfactory.  Bladder flap reattached with 2-0 chromic sutures. Uterus well contracted.  Tubes and ovaries were normal.  Abdomen closed in layers. Peritoneum __________ with 0 chromic, ____________ Dexon and the skin closed with subcuticular sutures of 3-0 plain.   Estimated blood loss was 850 cc. DD:  01/09/00 TD:  01/10/00 Job: [id number]

## 2010-09-20 NOTE — Discharge Summary (Signed)
NAMEKARRISA, [name]               ACCOUNT NO.:  [id number]   MEDICAL RECORD NO.:  [id number]          PATIENT TYPE:  INP   LOCATION:  9117                          [facility]   PHYSICIAN:  Kathreen Cosier, M.D.DATE OF BIRTH:  1970/07/22   DATE OF ADMISSION:  10/07/2004  DATE OF DISCHARGE:  10/10/2004                                 DISCHARGE SUMMARY   The patient is a 40 year old gravida 7, para 2, 2-3-4, who had three  previous C sections.  Her EDC was October 27, 2004 and had multiple admissions  with premature contractions.  She was now admitted in labor for repeat C  section and tubal ligation.  On admission, she underwent repeat low  transverse cesarean section and tubal ligation.  She had a female, Apgar 8, 9,  weighing 7 pounds 1 ounce.  On admission, her hemoglobin was 11.1,  postoperative 9.1, platelets 253 and 230, white count 5.9, 9.1.  Urinalysis  was negative.  RPR was nonreactive.  The patient did well and became  postoperative and had a temperature of 102 on the night of the second  postoperative night.  She refused IV antibiotics, so she was started on  Avelox 400 mg p.o. and by the morning of the third postoperative day, June  8. 2006, she was afebrile.  She wanted to go home, but was not discharged  until the evening of the third postoperative day.  She was discharged on  Tylox for pain, Avelox 400 for 10 days, to see me in six weeks.   DISCHARGE DIAGNOSES:  1.  Status post repeat low transverse cesarean section at term.  2.  Postpartum tubal ligation.       BAM/MEDQ  D:  10/30/2004  T:  10/30/2004  Job:  [id number]

## 2010-09-20 NOTE — Discharge Summary (Signed)
[facility]  Patient:    [name], [name]                      MRN: [id number] Adm. Date:  [id number] Disc. Date: [id number] Attending:  Venita Sheffield                           Discharge Summary  HISTORY OF PRESENT ILLNESS:   The patient is a 40 year old gravida 4, para 1-0-2-1, [facility] September 6 to September 17.  She had a previous C-section for CPD and desires repeat C-section.  She had an ultrasound performed two days prior to admission because of suspected macrosomia and an estimated fetal weight of 9 pounds 5 ounces.  The patients pregnancy was complicated by headaches, which were treated with Tylenol No. 3.  HOSPITAL COURSE:              On September 6, she underwent low transverse cesarean section and had a female, Apgar 8 and 9, weighing 7 pounds 15 ounces. Postoperatively, hemoglobin was 11.9.  She did well and was discharged home on the third postoperative day ambulatory, on a regular diet, on Tylenol No. 3 one to two every three to four hours p.r.n. for pain, and Cleocin 300 mg every six hours for five days.  DISCHARGE DIAGNOSIS:          Status post repeat low transverse cesarean                               section at term. DD:  01/12/00 TD:  01/13/00 Job: [id number]

## 2010-09-20 NOTE — Discharge Summary (Signed)
   NAMEAVIVA, WOLFER                         ACCOUNT NO.:  [id number]   MEDICAL RECORD NO.:  [id number]                   PATIENT TYPE:  INP   LOCATION:  9156                                 [facility]   PHYSICIAN:  Kathreen Cosier, M.D.           DATE OF BIRTH:  [birth year]   DATE OF ADMISSION:  11/28/2001  DATE OF DISCHARGE:  11/30/2001                                 DISCHARGE SUMMARY   BRIEF HOSPITAL COURSE:  The patient is a 40 year old gravida 5, para 2-2-2.  [facility] 03/08/02 pregnant with twins.  She was admitted with contractions and  uterine irritability and was placed on magnesium sulfate 4 g loading 2 g per  hour.  By 11/29/01 her contractions had ceased and she was given terbutaline  0.25 subcu then 2.5 mg p.o. every 4 hours.  She was discharged home on  11/30/01 on bedrest and regular diet to see me in 2 weeks.   DISCHARGE DIAGNOSIS:  Twin gestation with uterine irritability and premature  contractions.                                               Kathreen Cosier, M.D.    BAM/MEDQ  D:  12/29/2001  T:  12/30/2001  Job:  [id number]

## 2010-09-20 NOTE — Discharge Summary (Signed)
   [name], [name]                         ACCOUNT NO.:  [id number]   MEDICAL RECORD NO.:  [id number]                   PATIENT TYPE:  INP   LOCATION:  9157                                 [facility]   PHYSICIAN:  Kathreen Cosier, M.D.           DATE OF BIRTH:  03-24-1971   DATE OF ADMISSION:  11/18/2001  DATE OF DISCHARGE:  11/20/2001                                 DISCHARGE SUMMARY   The patient is a 40 year old gravida 5, para 2-2-2 pregnant with twins.  [facility]  03/08/02 was admitted because of premature contractions.  She was started on  magnesium sulfate 4 g loading and then 2 g per hour.  During this  hospitalization sometimes she had 3 g per hour of magnesium because she  would get runs of premature contractions and was started on Cleocin IV 800  mg and was decided to change her from terbutaline to Procardia. She got  Procardia 10 mg p.o. q.6h. Contractions eventually ceased and she was  discharged on 11/20/01 to see me in 2 weeks.   DISCHARGE DIAGNOSIS:  Status post intrauterine pregnancy with twins and  premature contractions.                                               Kathreen Cosier, M.D.    BAM/MEDQ  D:  12/29/2001  T:  12/30/2001  Job:  [id number]

## 2010-11-15 ENCOUNTER — Emergency Department (HOSPITAL_COMMUNITY)
Admission: EM | Admit: 2010-11-15 | Discharge: 2010-11-15 | Disposition: A | Discharge status: Home / Self Care | Payer: Medicaid Other | Attending: Emergency Medicine | Admitting: Emergency Medicine

## 2010-11-15 DIAGNOSIS — R51 Headache: Secondary | ICD-10-CM | POA: Insufficient documentation

## 2010-11-15 DIAGNOSIS — R110 Nausea: Secondary | ICD-10-CM | POA: Insufficient documentation

## 2011-01-08 ENCOUNTER — Other Ambulatory Visit (HOSPITAL_COMMUNITY): Payer: Self-pay | Admitting: Internal Medicine

## 2011-01-08 DIAGNOSIS — Z1231 Encounter for screening mammogram for malignant neoplasm of breast: Secondary | ICD-10-CM

## 2011-01-10 ENCOUNTER — Ambulatory Visit (HOSPITAL_COMMUNITY): Payer: Medicaid Other

## 2011-01-31 LAB — CBC
HCT: 39.9
Hemoglobin: 13.3
MCHC: 33.3
MCV: 92.2
Platelets: 223
RBC: 4.32
RDW: 12.2
WBC: 4.5

## 2011-01-31 LAB — URINE MICROSCOPIC-ADD ON

## 2011-01-31 LAB — URINALYSIS, ROUTINE W REFLEX MICROSCOPIC
Bilirubin Urine: NEGATIVE
Glucose, UA: NEGATIVE
Hgb urine dipstick: NEGATIVE
Ketones, ur: NEGATIVE
Nitrite: NEGATIVE
Protein, ur: NEGATIVE
Specific Gravity, Urine: 1.018
Urobilinogen, UA: 0.2
pH: 6

## 2011-01-31 LAB — DIFFERENTIAL
Basophils Absolute: 0.1
Basophils Relative: 2 — ABNORMAL HIGH
Eosinophils Absolute: 0.2
Eosinophils Relative: 4
Lymphocytes Relative: 54 — ABNORMAL HIGH
Lymphs Abs: 2.4
Monocytes Absolute: 0.3
Monocytes Relative: 7
Neutro Abs: 1.5 — ABNORMAL LOW
Neutrophils Relative %: 33 — ABNORMAL LOW

## 2011-01-31 LAB — POCT I-STAT, CHEM 8
BUN: 10
Calcium, Ion: 1.24
Chloride: 106
Creatinine, Ser: 1.1
Glucose, Bld: 87
HCT: 40
Hemoglobin: 13.6
Potassium: 3.9
Sodium: 139
TCO2: 24

## 2011-01-31 LAB — PREGNANCY, URINE: Preg Test, Ur: NEGATIVE

## 2011-01-31 LAB — GLUCOSE, CAPILLARY: Glucose-Capillary: 87

## 2011-02-11 LAB — PREGNANCY, URINE: Preg Test, Ur: NEGATIVE

## 2011-02-11 LAB — CBC
HCT: 38.4
Hemoglobin: 13.1
MCHC: 34.2
MCV: 85
Platelets: 242
RBC: 4.51
RDW: 14.2
WBC: 4.2

## 2011-02-13 ENCOUNTER — Ambulatory Visit (HOSPITAL_COMMUNITY)
Admission: RE | Admit: 2011-02-13 | Discharge: 2011-02-13 | Disposition: A | Discharge status: Home / Self Care | Payer: Medicaid Other | Source: Ambulatory Visit | Attending: Internal Medicine | Admitting: Internal Medicine

## 2011-02-13 DIAGNOSIS — Z1231 Encounter for screening mammogram for malignant neoplasm of breast: Secondary | ICD-10-CM

## 2011-02-18 ENCOUNTER — Other Ambulatory Visit: Payer: Self-pay | Admitting: Internal Medicine

## 2011-02-18 DIAGNOSIS — R928 Other abnormal and inconclusive findings on diagnostic imaging of breast: Secondary | ICD-10-CM

## 2011-03-04 ENCOUNTER — Ambulatory Visit
Admission: RE | Admit: 2011-03-04 | Discharge: 2011-03-04 | Disposition: A | Discharge status: Home / Self Care | Payer: Medicaid Other | Source: Ambulatory Visit | Attending: Internal Medicine | Admitting: Internal Medicine

## 2011-03-04 DIAGNOSIS — R928 Other abnormal and inconclusive findings on diagnostic imaging of breast: Secondary | ICD-10-CM

## 2011-06-10 ENCOUNTER — Ambulatory Visit (HOSPITAL_BASED_OUTPATIENT_CLINIC_OR_DEPARTMENT_OTHER): Payer: Medicaid Other | Attending: Otolaryngology

## 2011-06-10 VITALS — Ht <= 58 in | Wt 138.0 lb

## 2011-06-10 DIAGNOSIS — G4733 Obstructive sleep apnea (adult) (pediatric): Secondary | ICD-10-CM | POA: Insufficient documentation

## 2011-06-15 DIAGNOSIS — G4733 Obstructive sleep apnea (adult) (pediatric): Secondary | ICD-10-CM

## 2011-06-16 NOTE — Procedures (Signed)
[name], [name]               ACCOUNT NO.:  [id number]  MEDICAL RECORD NO.:  [id number]          PATIENT TYPE:  OUT  LOCATION:  SLEEP CENTER                 FACILITY[facility]  [facility]  PHYSICIAN:  Clinton D. Maple Hudson, MD, FCCP, FACPDATE OF BIRTH:  08/06/70  DATE OF STUDY:  06/10/2011                           NOCTURNAL POLYSOMNOGRAM  REFERRING PHYSICIAN:  Jefry H. Pollyann Kennedy, MD  REFERRING PHYSICIAN:  Jefry H. Pollyann Kennedy, MD  INDICATION FOR STUDY:  Insomnia with sleep apnea.  EPWORTH SLEEPINESS SCORE:  10/24.  BMI 23, weight 138 pounds, height 65 inches, neck 13 inches.  MEDICATIONS:  Home medications charted and reviewed.  SLEEP ARCHITECTURE:  Total sleep time 358 minutes with sleep efficiency 89.9%.  Stage I was 7.5%.  Stage II was 85.2%.  Stage III absent, REM 7.3% of total sleep time.  Sleep latency 16.5 minutes, REM latency 106 minutes.  Awake after sleep onset 23 minutes.  Arousal index 24. Bedtime medication, Ambien.  RESPIRATORY DATA:  Apnea-hypopnea index (AHI) 9.7 per hour.  A total of 58 events was scored including 11 obstructive apneas and 47 hypopneas. Events were seen in all sleep positions.  REM AHI 2.3 per hour.  This was a diagnostic NPSG protocol as ordered.  CPAP titration was not done.  OXYGEN DATA:  Moderately loud snoring with oxygen desaturation to a nadir of 90% and a mean oxygen saturation through the study of 96.1% on room air.  CARDIAC DATA:  Normal sinus rhythm.  MOVEMENT-PARASOMNIA:  A few limb jerks were noted with little effect on sleep.  No bathroom trips.  IMPRESSIONS-RECOMMENDATIONS:  Mild obstructive sleep apnea/hypopnea syndrome, apnea-hypopnea index 9.7 per hour with non-positional events, moderately loud snoring and oxygen desaturation to a nadir of 90% with mean oxygen saturation 96.1% on room air.  This was a diagnostic NPSG protocol.  Continuous positive airway pressure titration was not done.     Clinton D. Maple Hudson, MD, [facility], FACP Diplomate,  Biomedical engineer of Sleep Medicine Electronically Signed    CDY/MEDQ  D:  06/15/2011 08:30:53  T:  06/16/2011 00:05:43  Job:  [id number]

## 2011-07-29 ENCOUNTER — Encounter (HOSPITAL_BASED_OUTPATIENT_CLINIC_OR_DEPARTMENT_OTHER): Payer: Self-pay | Admitting: *Deleted

## 2011-08-02 NOTE — H&P (Signed)
  [name] is an 41 y.o. female.   Chief Complaint: Loud snoring and sleep apnea HPI: History of loud snoring and sleep apnea, very large tonsils.  Past Medical History  Diagnosis Date  . Headache   . GERD (gastroesophageal reflux disease)   . Seasonal allergies     Past Surgical History  Procedure Date  . Tubal ligation   . Cesarean section     X4    History reviewed. No pertinent family history. Social History:  reports that she has never smoked. She does not have any smokeless tobacco history on file. She reports that she does not drink alcohol or use illicit drugs.  Allergies:  Allergies  Allergen Reactions  . Penicillins Rash    No current facility-administered medications on file as of .   Medications Prior to Admission  Medication Sig Dispense Refill  . amitriptyline (ELAVIL) 50 MG tablet Take 50 mg by mouth at bedtime.      . cetirizine (ZYRTEC) 10 MG tablet Take 10 mg by mouth daily.      Marland Kitchen omeprazole (PRILOSEC) 20 MG capsule Take 20 mg by mouth as needed.      . zolpidem (AMBIEN) 10 MG tablet Take 10 mg by mouth at bedtime as needed.        No results found for this or any previous visit (from the past 48 hour(s)). No results found.  ROS: otherwise negative  Height 5\' 4"  (1.626 m), weight 142 lb (64.411 kg), last menstrual period 07/24/2011.  PHYSICAL EXAM: Overall appearance:  Healthy appearing, in no distress Head:  Normocephalic, atraumatic. Ears: External auditory canals are clear; tympanic membranes are intact and the middle ears are free of any effusion. Nose: External nose is healthy in appearance. Internal nasal exam free of any lesions or obstruction. Oral Cavity:  There are no mucosal lesions or masses identified. Oral Pharynx/Hypopharynx/Larynx: no signs of any mucosal lesions or masses identified. Tonsils are 4+ enlarged. Neuro:  No identifiable neurologic deficits. Neck: No palpable neck masses.  Studies Reviewed:  none    Assessment/Plan Tonsillectomy.  Laurenashley Viar 08/02/2011, 8:00 AM

## 2011-08-04 ENCOUNTER — Ambulatory Visit (HOSPITAL_BASED_OUTPATIENT_CLINIC_OR_DEPARTMENT_OTHER): Payer: Medicaid Other | Admitting: Anesthesiology

## 2011-08-04 ENCOUNTER — Encounter (HOSPITAL_BASED_OUTPATIENT_CLINIC_OR_DEPARTMENT_OTHER): Payer: Self-pay | Admitting: Anesthesiology

## 2011-08-04 ENCOUNTER — Encounter (HOSPITAL_BASED_OUTPATIENT_CLINIC_OR_DEPARTMENT_OTHER): Payer: Self-pay | Admitting: *Deleted

## 2011-08-04 ENCOUNTER — Ambulatory Visit (HOSPITAL_BASED_OUTPATIENT_CLINIC_OR_DEPARTMENT_OTHER)
Admission: RE | Admit: 2011-08-04 | Discharge: 2011-08-04 | Disposition: A | Discharge status: Home / Self Care | Payer: Medicaid Other | Source: Ambulatory Visit | Attending: Otolaryngology | Admitting: Otolaryngology

## 2011-08-04 ENCOUNTER — Encounter (HOSPITAL_BASED_OUTPATIENT_CLINIC_OR_DEPARTMENT_OTHER)
Admission: RE | Disposition: A | Discharge status: Home / Self Care | Payer: Self-pay | Source: Ambulatory Visit | Attending: Otolaryngology

## 2011-08-04 DIAGNOSIS — J351 Hypertrophy of tonsils: Secondary | ICD-10-CM | POA: Insufficient documentation

## 2011-08-04 DIAGNOSIS — K219 Gastro-esophageal reflux disease without esophagitis: Secondary | ICD-10-CM | POA: Insufficient documentation

## 2011-08-04 HISTORY — DX: Gastro-esophageal reflux disease without esophagitis: K21.9

## 2011-08-04 HISTORY — PX: TONSILLECTOMY: SHX5217

## 2011-08-04 HISTORY — DX: Headache: R51

## 2011-08-04 HISTORY — DX: Other seasonal allergic rhinitis: J30.2

## 2011-08-04 SURGERY — TONSILLECTOMY
Anesthesia: General | Site: Mouth | Wound class: Clean Contaminated

## 2011-08-04 MED ORDER — MORPHINE SULFATE 2 MG/ML IJ SOLN: 0.0500 mg/kg | INTRAMUSCULAR | Status: DC | PRN

## 2011-08-04 MED ORDER — LIDOCAINE HCL (CARDIAC) 20 MG/ML IV SOLN
INTRAVENOUS | Status: DC | PRN
  Administered 2011-08-04: 80 mg via INTRAVENOUS

## 2011-08-04 MED ORDER — METOCLOPRAMIDE HCL 5 MG/ML IJ SOLN: 10.0000 mg | Freq: Once | INTRAMUSCULAR | Status: DC | PRN

## 2011-08-04 MED ORDER — PROMETHAZINE HCL 25 MG RE SUPP: 25.0000 mg | Freq: Four times a day (QID) | RECTAL | Status: DC | PRN

## 2011-08-04 MED ORDER — PHENOL 1.4 % MT LIQD
1.0000 | OROMUCOSAL | Status: DC | PRN
  Administered 2011-08-04: 1 via OROMUCOSAL

## 2011-08-04 MED ORDER — DEXTROSE-NACL 5-0.9 % IV SOLN
INTRAVENOUS | Status: DC
  Administered 2011-08-04: 14:00:00 via INTRAVENOUS

## 2011-08-04 MED ORDER — FENTANYL CITRATE 0.05 MG/ML IJ SOLN
INTRAMUSCULAR | Status: DC | PRN
  Administered 2011-08-04: 100 ug via INTRAVENOUS
  Administered 2011-08-04: 50 ug via INTRAVENOUS

## 2011-08-04 MED ORDER — HYDROCODONE-ACETAMINOPHEN 7.5-500 MG/15ML PO SOLN: 10.0000 mL | ORAL | Status: DC | PRN

## 2011-08-04 MED ORDER — ACETAMINOPHEN 10 MG/ML IV SOLN
1000.0000 mg | Freq: Once | INTRAVENOUS | Status: AC
  Administered 2011-08-04: 1000 mg via INTRAVENOUS

## 2011-08-04 MED ORDER — HYDROCODONE-ACETAMINOPHEN 7.5-500 MG/15ML PO SOLN: 15.0000 mL | Freq: Four times a day (QID) | ORAL | Status: AC | PRN

## 2011-08-04 MED ORDER — PROMETHAZINE HCL 25 MG PO TABS: 25.0000 mg | ORAL_TABLET | Freq: Four times a day (QID) | ORAL | Status: DC | PRN

## 2011-08-04 MED ORDER — LACTATED RINGERS IV SOLN
INTRAVENOUS | Status: DC
  Administered 2011-08-04 (×3): via INTRAVENOUS

## 2011-08-04 MED ORDER — OXYCODONE HCL 5 MG/5ML PO SOLN
5.0000 mg | Freq: Once | ORAL | Status: AC
  Administered 2011-08-04: 5 mg via ORAL

## 2011-08-04 MED ORDER — MIDAZOLAM HCL 2 MG/2ML IJ SOLN: 0.5000 mg | INTRAMUSCULAR | Status: DC | PRN

## 2011-08-04 MED ORDER — DEXAMETHASONE SODIUM PHOSPHATE 4 MG/ML IJ SOLN
INTRAMUSCULAR | Status: DC | PRN
  Administered 2011-08-04: 10 mg via INTRAVENOUS

## 2011-08-04 MED ORDER — MIDAZOLAM HCL 5 MG/5ML IJ SOLN
INTRAMUSCULAR | Status: DC | PRN
  Administered 2011-08-04: 2 mg via INTRAVENOUS

## 2011-08-04 MED ORDER — HYDROMORPHONE HCL PF 1 MG/ML IJ SOLN
0.2500 mg | INTRAMUSCULAR | Status: DC | PRN
  Administered 2011-08-04 (×4): 0.5 mg via INTRAVENOUS

## 2011-08-04 MED ORDER — SUCCINYLCHOLINE CHLORIDE 20 MG/ML IJ SOLN
INTRAMUSCULAR | Status: DC | PRN
  Administered 2011-08-04: 100 mg via INTRAVENOUS

## 2011-08-04 MED ORDER — DIPHENHYDRAMINE HCL 25 MG PO CAPS
25.0000 mg | ORAL_CAPSULE | Freq: Four times a day (QID) | ORAL | Status: DC | PRN
  Administered 2011-08-04: 25 mg via ORAL

## 2011-08-04 MED ORDER — PROPOFOL 10 MG/ML IV EMUL
INTRAVENOUS | Status: DC | PRN
  Administered 2011-08-04: 200 mg via INTRAVENOUS

## 2011-08-04 MED ORDER — ONDANSETRON HCL 4 MG/2ML IJ SOLN
INTRAMUSCULAR | Status: DC | PRN
  Administered 2011-08-04: 4 mg via INTRAVENOUS

## 2011-08-04 MED ORDER — IBUPROFEN 100 MG/5ML PO SUSP
400.0000 mg | Freq: Four times a day (QID) | ORAL | Status: DC | PRN
  Administered 2011-08-04: 400 mg via ORAL

## 2011-08-04 MED ORDER — DEXAMETHASONE SODIUM PHOSPHATE 4 MG/ML IJ SOLN: 12.0000 mg | Freq: Once | INTRAMUSCULAR | Status: DC

## 2011-08-04 SURGICAL SUPPLY — 29 items
CANISTER SUCTION 1200CC (MISCELLANEOUS) ×2 IMPLANT
CATH ROBINSON RED A/P 12FR (CATHETERS) IMPLANT
CLOTH BEACON ORANGE TIMEOUT ST (SAFETY) ×2 IMPLANT
COAGULATOR SUCT SWTCH 10FR 6 (ELECTROSURGICAL) ×1 IMPLANT
COVER MAYO STAND STRL (DRAPES) ×2 IMPLANT
ELECT COATED BLADE 2.86 ST (ELECTRODE) ×2 IMPLANT
ELECT REM PT RETURN 9FT ADLT (ELECTROSURGICAL) ×2
ELECT REM PT RETURN 9FT PED (ELECTROSURGICAL)
ELECTRODE REM PT RETRN 9FT PED (ELECTROSURGICAL) IMPLANT
ELECTRODE REM PT RTRN 9FT ADLT (ELECTROSURGICAL) IMPLANT
GAUZE SPONGE 4X4 12PLY STRL LF (GAUZE/BANDAGES/DRESSINGS) ×2 IMPLANT
GLOVE BIO SURGEON STRL SZ 6.5 (GLOVE) ×1 IMPLANT
GLOVE ECLIPSE 7.5 STRL STRAW (GLOVE) ×2 IMPLANT
GLOVE SKINSENSE NS SZ7.0 (GLOVE) ×1
GLOVE SKINSENSE STRL SZ7.0 (GLOVE) IMPLANT
GOWN PREVENTION PLUS XLARGE (GOWN DISPOSABLE) ×3 IMPLANT
MARKER SKIN DUAL TIP RULER LAB (MISCELLANEOUS) IMPLANT
NS IRRIG 1000ML POUR BTL (IV SOLUTION) ×2 IMPLANT
PENCIL FOOT CONTROL (ELECTRODE) ×2 IMPLANT
SHEET MEDIUM DRAPE 40X70 STRL (DRAPES) ×3 IMPLANT
SOLUTION BUTLER CLEAR DIP (MISCELLANEOUS) ×1 IMPLANT
SPONGE TONSIL 1 RF SGL (DISPOSABLE) IMPLANT
SPONGE TONSIL 1.25 RF SGL STRG (GAUZE/BANDAGES/DRESSINGS) ×1 IMPLANT
SYR BULB 3OZ (MISCELLANEOUS) ×2 IMPLANT
TOWEL OR 17X24 6PK STRL BLUE (TOWEL DISPOSABLE) ×2 IMPLANT
TUBE CONNECTING 20X1/4 (TUBING) ×2 IMPLANT
TUBE SALEM SUMP 12R W/ARV (TUBING) IMPLANT
TUBE SALEM SUMP 16 FR W/ARV (TUBING) ×2 IMPLANT
WATER STERILE IRR 1000ML POUR (IV SOLUTION) ×1 IMPLANT

## 2011-08-04 NOTE — Anesthesia Preprocedure Evaluation (Signed)
Anesthesia Evaluation  Patient identified by MRN, date of birth, ID band Patient awake    Reviewed: Allergy & Precautions, H&P , NPO status , Patient's Chart, lab work & pertinent test results, reviewed documented beta blocker date and time   Airway Mallampati: II TM Distance: >3 FB Neck ROM: full    Dental   Pulmonary neg pulmonary ROS,          Cardiovascular negative cardio ROS      Neuro/Psych  Headaches, negative psych ROS   GI/Hepatic negative GI ROS, Neg liver ROS, GERD-  Medicated and Controlled,  Endo/Other  negative endocrine ROS  Renal/GU negative Renal ROS  negative genitourinary   Musculoskeletal   Abdominal   Peds  Hematology negative hematology ROS (+)   Anesthesia Other Findings See surgeon's H&P   Reproductive/Obstetrics negative OB ROS                           Anesthesia Physical Anesthesia Plan  ASA: II  Anesthesia Plan: General   Post-op Pain Management:    Induction: Intravenous  Airway Management Planned: Oral ETT  Additional Equipment:   Intra-op Plan:   Post-operative Plan: Extubation in OR  Informed Consent: I have reviewed the patients History and Physical, chart, labs and discussed the procedure including the risks, benefits and alternatives for the proposed anesthesia with the patient or authorized representative who has indicated his/her understanding and acceptance.     Plan Discussed with: CRNA and Surgeon  Anesthesia Plan Comments:         Anesthesia Quick Evaluation

## 2011-08-04 NOTE — Transfer of Care (Signed)
Immediate Anesthesia Transfer of Care Note  Patient: [name]  Procedure(s) Performed: Procedure(s) (LRB): TONSILLECTOMY (N/A)  Patient Location: PACU  Anesthesia Type: General  Level of Consciousness: sedated  Airway & Oxygen Therapy: Patient Spontanous Breathing and Patient connected to face mask oxygen  Post-op Assessment: Report given to PACU RN  Post vital signs: Reviewed and stable  Complications: No apparent anesthesia complications

## 2011-08-04 NOTE — Anesthesia Procedure Notes (Signed)
Procedure Name: Intubation Date/Time: 08/04/2011 12:13 PM Performed by: Gar Gibbon Pre-anesthesia Checklist: Patient identified, Emergency Drugs available, Suction available and Patient being monitored Patient Re-evaluated:Patient Re-evaluated prior to inductionOxygen Delivery Method: Circle System Utilized Preoxygenation: Pre-oxygenation with 100% oxygen Intubation Type: IV induction Ventilation: Mask ventilation without difficulty Laryngoscope Size: Mac and 3 Grade View: Grade II Tube type: Oral Number of attempts: 1 Airway Equipment and Method: stylet and oral airway Placement Confirmation: ETT inserted through vocal cords under direct vision,  positive ETCO2 and breath sounds checked- equal and bilateral Secured at: 21 cm Tube secured with: Tape Dental Injury: Teeth and Oropharynx as per pre-operative assessment

## 2011-08-04 NOTE — Interval H&P Note (Signed)
History and Physical Interval Note:  08/04/2011 11:32 AM  [name]  has presented today for surgery, with the diagnosis of tonsil hypertrophy  The various methods of treatment have been discussed with the patient and family. After consideration of risks, benefits and other options for treatment, the patient has consented to  Procedure(s) (LRB): TONSILLECTOMY (N/A) as a surgical intervention .  The patients' history has been reviewed, patient examined, no change in status, stable for surgery.  I have reviewed the patients' chart and labs.  Questions were answered to the patient's satisfaction.     ROSEN, JEFRY

## 2011-08-04 NOTE — Discharge Instructions (Signed)
Tonsillectomy, Adult Care After Refer to this sheet in the next few weeks. These instructions provide you with information on caring for yourself after your procedure. Your caregiver may also give you specific instructions. Your treatment has been planned according to current medical practices, but problems sometimes occur. Call your caregiver if you have any problems or questions after your procedure. HOME CARE INSTRUCTIONS   Obtain proper rest, keeping your head elevated at all times. You will feel worn out and tired for a while.   Drink plenty of fluids. This reduces pain and hastens the healing process.  Only take over-the-counter or prescription medicines for pain, discomfort, or fever as directed by your caregiver  [facility]  [address] [place], [place] [address] [phone]   Post Anesthesia Home Care Instructions  Activity: Get plenty of rest for the remainder of the day. A responsible adult should stay with you for 24 hours following the procedure.  For the next 24 hours, DO NOT: -Drive a car -Advertising copywriter -Drink alcoholic beverages -Take any medication unless instructed by your physician -Make any legal decisions or sign important papers.  Meals: Start with liquid foods such as gelatin or soup. Progress to regular foods as tolerated. Avoid greasy, spicy, heavy foods. If nausea and/or vomiting occur, drink only clear liquids until the nausea and/or vomiting subsides. Call your physician if vomiting continues.  Special Instructions/Symptoms: Your throat may feel dry or sore from the anesthesia or the breathing tube placed in your throat during surgery. If this causes discomfort, gargle with warm salt water. The discomfort should disappear within 24 hours.    Diet Following Tonsillectomy, Adult You may have a sore throat for 5 to 7 days following removal of tonsils (tonsillectomy). During this time, eating food that is easier to swallow and  less irritating to your throat makes your recovery easier.  THE FIRST 24 HOURS AFTER SURGERY A diet of clear liquids is easiest to swallow and less irritating.  Drink water, lukewarm chicken broth, lemon lime sodas (if opened and allowed to lose the fizz), and apple juice.  Try frozen ice pops and ice chips to soothe your throat.  Avoid rough and crunchy food; hot or highly seasoned food; hot liquids; citrus juices, such as orange juice and tomato juice; and fatty milk products.   THE SECOND DAY AND BEYOND  Drink several glasses of water (lukewarm water is less irritating than cold water).  Add soft foods as desired. Good soft food choices include sherbet, cooked cereals, mashed potatoes, pureed vegetables, pasta with butter, puddings, warm soups, gelatin, yogurt, cottage cheese, soft-cooked or scrambled eggs, custards, and ground beef in gravy or sauce. A soft diet should be continued through the third day.  You may benefit from a nutritional supplement like a liquid nutrition beverage.  Avoid citrus juices and spicy foods until 10 days after surgery.  Avoid roughly textured foods, such as potato chips, nuts, dry toast, popcorn, pretzels, cold cereal, and crackers, until 10 to 14 days after surgery.  Avoid taking aspirin nonsteroidal anti-inflammatory medications for pain because they can worsen bleeding. Only take medication for pain that is recommended by your caregiver or surgeon.  Document Released: 04/21/2005 Document Revised: 04/10/2011 Document Reviewed: 06/27/2010  [facility] Patient Information 2012 [place], [place]. of bleeding.   Sometimes the use of pain medication can cause constipation. If this happens, ask your caregiver about laxatives that you can take.   When eating, only eat a small portion of your  food and then take your prescribed pain medication. Eat the remainder of your food 45 minutes later. This will make swallowing less painful.   Soft and cold foods, such as gelatin,  sherbet, ice cream, frozen ice pops, and cold drinks, are usually the easiest to eat. Several days after surgery, you will be able to eat more solid food.   Avoid mouth washes and gargles.   Avoid contact with people who have upper respiratory infections, such as colds and sore throats.   An ice pack applied to your neck may help with discomfort and keep swelling down.  SEEK MEDICAL CARE IF:   You have increasing pain that is not controlled with medications.   You have an oral temperature above 102 F (38.9 C).   You feel lightheaded or have a fainting spell.   You develop a rash.  SEEK IMMEDIATE MEDICAL CARE IF:   You have difficulty breathing.   You experience side effects or allergic reactions to medications.   You bleed bright red blood from your throat, or you vomit bright red blood.  MAKE SURE YOU:  Understand these instructions.   Will watch your condition.   Will get help right away if you are not doing well or get worse.  Document Released: 02/20/2004 Document Revised: 04/10/2011 Document Reviewed: 06/27/2010 [facility] Patient Information 2012 [place], [place].

## 2011-08-04 NOTE — Op Note (Signed)
08/04/2011  12:33 PM  PATIENT:  [name]  41 y.o. female  PRE-OPERATIVE DIAGNOSIS:  tonsil hypertrophy  POST-OPERATIVE DIAGNOSIS:  tonsil hypertrophy  PROCEDURE:  Procedure(s): TONSILLECTOMY  SURGEON:  Surgeon(s): Serena Colonel, MD  ANESTHESIA:   general  COUNTS:  YES   DICTATION: The patient was taken to the operating room and placed on the operating table in the supine position. Following induction of general endotracheal anesthesia, the table was turned and the patient was draped in a standard fashion. A Crowe-Davis mouthgag was inserted into the oral cavity and used to retract the tongue and mandible, then attached to the Mayo stand.  The tonsillectomy was then performed using electrocautery dissection, carefully dissecting the avascular plane between the capsule and constrictor muscles. Cautery was used for completion of hemostasis. The tonsils were discarded.  The pharynx was irrigated with saline and suctioned. An oral gastric tube was used to aspirate the contents of the stomach. The patient was then awakened from anesthesia and transferred to PACU in stable condition.   PATIENT DISPOSITION:  PACU - hemodynamically stable.

## 2011-08-04 NOTE — Anesthesia Postprocedure Evaluation (Signed)
Anesthesia Post Note  Patient: [name]  Procedure(s) Performed: Procedure(s) (LRB): TONSILLECTOMY (N/A)  Anesthesia type: General  Patient location: PACU  Post pain: Pain level controlled  Post assessment: Patient's Cardiovascular Status Stable  Last Vitals:  Filed Vitals:   08/04/11 1406  BP: 144/80  Pulse: 76  Temp: 36.5 C  Resp: 16    Post vital signs: Reviewed and stable  Level of consciousness: alert  Complications: No apparent anesthesia complications

## 2011-08-07 ENCOUNTER — Encounter (HOSPITAL_BASED_OUTPATIENT_CLINIC_OR_DEPARTMENT_OTHER): Payer: Self-pay | Admitting: Otolaryngology

## 2011-08-07 LAB — POCT HEMOGLOBIN-HEMACUE: Hemoglobin: 12.1 g/dL (ref 12.0–15.0)

## 2012-02-04 ENCOUNTER — Other Ambulatory Visit (HOSPITAL_COMMUNITY): Payer: Self-pay | Admitting: Internal Medicine

## 2012-02-04 DIAGNOSIS — Z1231 Encounter for screening mammogram for malignant neoplasm of breast: Secondary | ICD-10-CM

## 2012-03-08 ENCOUNTER — Ambulatory Visit (HOSPITAL_COMMUNITY): Payer: Medicaid Other

## 2012-03-18 ENCOUNTER — Ambulatory Visit (HOSPITAL_COMMUNITY): Payer: Medicaid Other

## 2012-03-26 ENCOUNTER — Ambulatory Visit (HOSPITAL_COMMUNITY)
Admission: RE | Admit: 2012-03-26 | Discharge: 2012-03-26 | Disposition: A | Discharge status: Home / Self Care | Payer: Medicaid Other | Source: Ambulatory Visit | Attending: Internal Medicine | Admitting: Internal Medicine

## 2012-03-26 DIAGNOSIS — Z1231 Encounter for screening mammogram for malignant neoplasm of breast: Secondary | ICD-10-CM | POA: Insufficient documentation

## 2012-04-09 ENCOUNTER — Ambulatory Visit (HOSPITAL_COMMUNITY): Payer: Medicaid Other

## 2012-05-24 ENCOUNTER — Emergency Department (HOSPITAL_COMMUNITY)
Admission: EM | Admit: 2012-05-24 | Discharge: 2012-05-24 | Disposition: A | Discharge status: Home / Self Care | Payer: 59 | Attending: Emergency Medicine | Admitting: Emergency Medicine

## 2012-05-24 DIAGNOSIS — Z8709 Personal history of other diseases of the respiratory system: Secondary | ICD-10-CM | POA: Insufficient documentation

## 2012-05-24 DIAGNOSIS — R51 Headache: Secondary | ICD-10-CM | POA: Insufficient documentation

## 2012-05-24 DIAGNOSIS — Z79899 Other long term (current) drug therapy: Secondary | ICD-10-CM | POA: Insufficient documentation

## 2012-05-24 DIAGNOSIS — K219 Gastro-esophageal reflux disease without esophagitis: Secondary | ICD-10-CM | POA: Insufficient documentation

## 2012-05-24 DIAGNOSIS — L989 Disorder of the skin and subcutaneous tissue, unspecified: Secondary | ICD-10-CM | POA: Insufficient documentation

## 2012-05-24 DIAGNOSIS — R238 Other skin changes: Secondary | ICD-10-CM

## 2012-05-24 MED ORDER — IBUPROFEN 600 MG PO TABS: 600.0000 mg | ORAL_TABLET | Freq: Four times a day (QID) | ORAL | Status: DC | PRN

## 2012-05-24 NOTE — ED Notes (Signed)
Pt c/o pain to L calf area. Pt has red raised warm area to L calf since this morning. Pt worried it may be a "spider bite". Pt denies any other red areas. Pt ambulatory to exam room with steady gait.

## 2012-05-24 NOTE — ED Provider Notes (Signed)
Medical screening examination/treatment/procedure(s) were performed by non-physician practitioner and as supervising physician I was immediately available for consultation/collaboration.   Stephen Rancour, MD 05/24/12 2350 

## 2012-05-24 NOTE — ED Provider Notes (Signed)
History  This chart was scribed for non-physician practitioner working with Glynn Octave, MD by Erskine Emery, ED Scribe. This patient was seen in room WTR6/WTR6 and the patient's care was started at 19:25.    CSN: [id number]  Arrival date & time 05/24/12  1813   First MD Initiated Contact with Patient 05/24/12 1925      Chief Complaint  Patient presents with  . Insect Bite    (Consider location/radiation/quality/duration/timing/severity/associated sxs/prior treatment) The history is provided by the patient. No language interpreter was used.  [name] is a 42 y.o. female who presents to the Emergency Department complaining of gradually worsening erythematous, raised, red area to the left calf that is stinging and painful to palpation since this morning. Did not see insect bite.  Pt denies any radiation of pain, any other red areas, itching, chest pain, difficulty breathing, fever, recent surgery, recent long trips, or new pets. Pt is ambulatory with a steady gait. She has a h/o tubal ligation.  Pt is allergic to Penicillins. She has a PCP for follow up.  Past Medical History  Diagnosis Date  . Headache   . GERD (gastroesophageal reflux disease)   . Seasonal allergies     Past Surgical History  Procedure Date  . Tubal ligation   . Cesarean section     X4  . Tonsillectomy 08/04/2011    Procedure: TONSILLECTOMY;  Surgeon: Serena Colonel, MD;  Location: [facility];  Service: ENT;  Laterality: N/A;    No family history on file.  History  Substance Use Topics  . Smoking status: Never Smoker   . Smokeless tobacco: Not on file  . Alcohol Use: No    OB History    Grav Para Term Preterm Abortions TAB SAB Ect Mult Living                  Review of Systems  Constitutional: Negative for fever and chills.  HENT: Negative for neck pain.   Eyes: Negative for visual disturbance.  Respiratory: Negative for shortness of breath.   Cardiovascular: Negative for  chest pain.  Gastrointestinal: Negative for nausea and vomiting.  Genitourinary: Negative for dysuria.  Musculoskeletal: Negative for back pain.  Skin:       Raised, warm, erythematous bump to the left calf.  Neurological: Negative for weakness.  Psychiatric/Behavioral: The patient is not nervous/anxious.   All other systems reviewed and are negative.    Allergies  Penicillins  Home Medications   Current Outpatient Rx  Name  Route  Sig  Dispense  Refill  . AMITRIPTYLINE HCL 50 MG PO TABS   Oral   Take 50 mg by mouth at bedtime.         . ADULT MULTIVITAMIN W/MINERALS CH   Oral   Take 1 tablet by mouth daily.         Marland Kitchen ZOLPIDEM TARTRATE 10 MG PO TABS   Oral   Take 10 mg by mouth at bedtime.          Marland Kitchen PROMETHAZINE HCL 25 MG RE SUPP   Rectal   Place 1 suppository (25 mg total) rectally every 6 (six) hours as needed for nausea.   12 each   0     Triage Vitals: BP 133/89  Pulse 92  Temp 98.3 F (36.8 C) (Oral)  Resp 16  SpO2 100%  Physical Exam  Nursing note and vitals reviewed. Constitutional: She is oriented to person, place, and time. She appears well-developed  and well-nourished. No distress.  HENT:  Head: Normocephalic and atraumatic.  Eyes: EOM are normal. Pupils are equal, round, and reactive to light.  Neck: Neck supple. No tracheal deviation present.  Cardiovascular: Normal rate.   Pulmonary/Chest: Effort normal. No respiratory distress.  Abdominal: Soft. She exhibits no distension.  Musculoskeletal: Normal range of motion. She exhibits no edema.  Neurological: She is alert and oriented to person, place, and time.  Skin: Skin is warm and dry.       Minimal warmth and mild redness to left calf. Mild induration without any fluctuants. No palpable cords. Homen's sign is negative.  Psychiatric: She has a normal mood and affect.    ED Course  Procedures (including critical care time) DIAGNOSTIC STUDIES: Oxygen Saturation is 100% on room air,  normal by my interpretation.    COORDINATION OF CARE: 20:00--I evaluated the patient and we discussed a treatment plan including warm compress (20 minutes x3/day for 3 days), ibuprofen, and pain medication to which the pt agreed. I notified the pt to return if she notices any fever, greater redness or red streaks, or drainage from the area.   Labs Reviewed - No data to display No results found.   No diagnosis found.  1. Skin irritation, L calf  MDM  Pt with localized infection.  Does not appears to be cellulitis or abscess, although could be early forming.  Care instruction given.  Return precaution given.  Pt is allergic to PCN.  Therefore, no strong indication for abx at this time.  Is afebrile, VSS.  Doubt DVT, or PE   BP 133/89  Pulse 92  Temp 98.3 F (36.8 C) (Oral)  Resp 16  SpO2 100%  I personally performed the services described in this documentation, which was scribed in my presence. The recorded information has been reviewed and is accurate.    Fayrene Helper, PA-C 05/24/12 2013

## 2013-03-17 ENCOUNTER — Other Ambulatory Visit (HOSPITAL_COMMUNITY): Payer: Self-pay | Admitting: Internal Medicine

## 2013-03-17 DIAGNOSIS — Z1231 Encounter for screening mammogram for malignant neoplasm of breast: Secondary | ICD-10-CM

## 2013-04-01 ENCOUNTER — Ambulatory Visit (HOSPITAL_COMMUNITY)
Admission: RE | Admit: 2013-04-01 | Discharge: 2013-04-01 | Disposition: A | Discharge status: Home / Self Care | Payer: No Typology Code available for payment source | Source: Ambulatory Visit | Attending: Internal Medicine | Admitting: Internal Medicine

## 2013-04-01 DIAGNOSIS — Z1231 Encounter for screening mammogram for malignant neoplasm of breast: Secondary | ICD-10-CM

## 2013-07-13 ENCOUNTER — Ambulatory Visit: Payer: PRIVATE HEALTH INSURANCE

## 2013-07-25 ENCOUNTER — Ambulatory Visit: Payer: PRIVATE HEALTH INSURANCE

## 2014-03-27 ENCOUNTER — Other Ambulatory Visit (HOSPITAL_COMMUNITY): Payer: Self-pay | Admitting: Internal Medicine

## 2014-03-27 DIAGNOSIS — Z1231 Encounter for screening mammogram for malignant neoplasm of breast: Secondary | ICD-10-CM

## 2014-04-05 ENCOUNTER — Ambulatory Visit (HOSPITAL_COMMUNITY)
Admission: RE | Admit: 2014-04-05 | Discharge: 2014-04-05 | Disposition: A | Discharge status: Home / Self Care | Payer: Managed Care, Other (non HMO) | Source: Ambulatory Visit | Attending: Internal Medicine | Admitting: Internal Medicine

## 2014-04-05 DIAGNOSIS — Z1231 Encounter for screening mammogram for malignant neoplasm of breast: Secondary | ICD-10-CM | POA: Insufficient documentation

## 2014-05-15 ENCOUNTER — Emergency Department (INDEPENDENT_AMBULATORY_CARE_PROVIDER_SITE_OTHER)
Admission: EM | Admit: 2014-05-15 | Discharge: 2014-05-15 | Disposition: A | Discharge status: Home / Self Care | Payer: Managed Care, Other (non HMO) | Source: Home / Self Care | Attending: Family Medicine | Admitting: Family Medicine

## 2014-05-15 ENCOUNTER — Encounter (HOSPITAL_COMMUNITY): Payer: Self-pay | Admitting: Emergency Medicine

## 2014-05-15 DIAGNOSIS — H15102 Unspecified episcleritis, left eye: Secondary | ICD-10-CM

## 2014-05-15 MED ORDER — DICLOFENAC SODIUM 0.1 % OP SOLN: 1.0000 [drp] | Freq: Four times a day (QID) | OPHTHALMIC | Status: DC

## 2014-05-15 NOTE — ED Provider Notes (Signed)
Seth Bakeravon L Waddell is a 44 y.o. female who presents to Urgent Care today for conjunctival injection. Patient developed left eye lateral conjunctival injection today. She feels mild irritation but denies any pain foreign body sensation itching fevers or chills or blurry vision. Last week she had a scratchy throat. She feels well otherwise. No treatment used yet. No contact lenses   Past Medical History  Diagnosis Date  . Headache(784.0)   . GERD (gastroesophageal reflux disease)   . Seasonal allergies    Past Surgical History  Procedure Laterality Date  . Tubal ligation    . Cesarean section      X4  . Tonsillectomy  08/04/2011    Procedure: TONSILLECTOMY;  Surgeon: Serena ColonelJefry Rosen, MD;  Location: [facility];  Service: ENT;  Laterality: N/A;   History  Substance Use Topics  . Smoking status: Never Smoker   . Smokeless tobacco: Not on file  . Alcohol Use: No   ROS as above Medications: No current facility-administered medications for this encounter.   Current Outpatient Prescriptions  Medication Sig Dispense Refill  . amitriptyline (ELAVIL) 50 MG tablet Take 50 mg by mouth at bedtime.    Marland Kitchen. ibuprofen (ADVIL,MOTRIN) 600 MG tablet Take 1 tablet (600 mg total) by mouth every 6 (six) hours as needed for pain. 20 tablet 0  . Multiple Vitamin (MULTIVITAMIN WITH MINERALS) TABS Take 1 tablet by mouth daily.    Marland Kitchen. zolpidem (AMBIEN) 10 MG tablet Take 10 mg by mouth at bedtime.     . diclofenac (VOLTAREN) 0.1 % ophthalmic solution Place 1 drop into the left eye 4 (four) times daily. 5 mL 0  . promethazine (PHENERGAN) 25 MG suppository Place 1 suppository (25 mg total) rectally every 6 (six) hours as needed for nausea. 12 each 0   Allergies  Allergen Reactions  . Penicillins Rash     Exam:  BP 117/81 mmHg  Pulse 85  Temp(Src) 97.6 F (36.4 C) (Oral)  Resp 14  SpO2 97%  LMP 04/30/2014 (Exact Date) Gen: Well NAD HEENT: EOMI,  MMM left eye streak of mild conjunctiva injection  lateral aspect of the eye. Spares the limbus. PERRL bilaterally Visual Acuity:  Right Eye Distance: 20/30 Left Eye Distance: 20/20 Bilateral Distance: 20/20  Lungs: Normal work of breathing. CTABL Heart: RRR no MRG Abd: NABS, Soft. Nondistended, Nontender Exts: Brisk capillary refill, warm and well perfused.   No results found for this or any previous visit (from the past 24 hour(s)). No results found.  Assessment and Plan: 44 y.o. female with probable episcleritis. Treat with diclofenac eyedrops. Follow-up as needed.  Discussed warning signs or symptoms. Please see discharge instructions. Patient expresses understanding.     Rodolph BongEvan S Corey, MD 05/15/14 1025

## 2014-05-15 NOTE — ED Notes (Signed)
Pt woke up this morning and saw that her left eye was red.  She starts mild irritation but no pain, no discharge and no injury to the eye.

## 2014-05-15 NOTE — Discharge Instructions (Signed)
Thank you for coming in today. Use the drops for a few days as needed.  Come back as needed.    Scleritis and Episcleritis The outer part of the eyeball is covered with a tough fibrous covering called the sclera. It is the white part of the eye. This tough covering also has a thin membrane lying on top of it called the episclera.   When the sclera becomes red and sore (inflamed), it is called scleritis.  When the episclera becomes inflamed, it is called episcleritis. CAUSES   Scleritis is usually more severe and is associated with autoimmune diseases such as:  Rheumatoid arthritis.  Inflammations of the bowel such as Crohn's Disease (regional enteritis).  Ulcerative colitis.  Episcleritis usually has no known cause. SYMPTOMS  Both scleritis and episcleritis cause red patches or a nodule on the eye. DIAGNOSIS  This condition should be examined by an ophthalmologist. This is because very strong medications that have side effects to the body and eye may be required to treat severe attacks. Further investigations into the patient's general health may be necessary. TREATMENT   Episcleritis tends to get better without treatment within a week or two.  Scleritis is more severe. Often, your caregiver will prescribe steroids by mouth (orally) or as drops in the eye. This treatment helps lessen the redness and soreness (inflammation). HOME CARE INSTRUCTIONS   Take all medications as directed.  Keep your follow-up appointments as directed.  Avoid irritation of the involved eye(s).  Stop using hard or soft contact lenses until your caregiver tells you that it is safe to use them. SEEK MEDICAL CARE IF:   Redness or irritation gets worse.  You develop pain or sensitivity to light.  You develop any change in vision in the involved eye(s). Document Released: 04/15/2001 Document Revised: 07/14/2011 Document Reviewed: 08/17/2008 [facility] Patient Information 2015 [place]. This  information is not intended to replace advice given to you by your health care provider. Make sure you discuss any questions you have with your health care provider.

## 2014-08-09 LAB — CYTOLOGY - PAP: Pap: NEGATIVE

## 2014-12-13 ENCOUNTER — Encounter (HOSPITAL_COMMUNITY): Payer: Self-pay | Admitting: Emergency Medicine

## 2014-12-13 ENCOUNTER — Emergency Department (INDEPENDENT_AMBULATORY_CARE_PROVIDER_SITE_OTHER): Payer: Managed Care, Other (non HMO)

## 2014-12-13 ENCOUNTER — Emergency Department (INDEPENDENT_AMBULATORY_CARE_PROVIDER_SITE_OTHER)
Admission: EM | Admit: 2014-12-13 | Discharge: 2014-12-13 | Disposition: A | Discharge status: Home / Self Care | Payer: Managed Care, Other (non HMO) | Source: Home / Self Care | Attending: Family Medicine | Admitting: Family Medicine

## 2014-12-13 DIAGNOSIS — S92402A Displaced unspecified fracture of left great toe, initial encounter for closed fracture: Secondary | ICD-10-CM

## 2014-12-13 MED ORDER — DICLOFENAC SODIUM 75 MG PO TBEC: 75.0000 mg | DELAYED_RELEASE_TABLET | Freq: Two times a day (BID) | ORAL | Status: DC

## 2014-12-13 NOTE — ED Notes (Signed)
Pt tripped on a box this morning and injured her Left Great Toe.  The toe is swollen, bruised and she has no ROM.  The rest of the foot feels fine.

## 2014-12-13 NOTE — ED Provider Notes (Signed)
CSN: [id number]     Arrival date & time 12/13/14  1310 History   First MD Initiated Contact with Patient 12/13/14 1407     Chief Complaint  Patient presents with  . Toe Injury   (Consider location/radiation/quality/duration/timing/severity/associated sxs/prior Treatment) HPI   Left great toe injury. Patient states that this morning around 10:00 she tripped over some boxes at work and ended up bending her great toe down towards the floor. [name] immediately went numb. Tender to palpation and with ambulation. Worse with ambulation. Improves with rest and elevation. Aleve without much improvement. No previous trauma to the toe.  Past Medical History  Diagnosis Date  . Headache(784.0)   . GERD (gastroesophageal reflux disease)   . Seasonal allergies    Past Surgical History  Procedure Laterality Date  . Tubal ligation    . Cesarean section      X4  . Tonsillectomy  08/04/2011    Procedure: TONSILLECTOMY;  Surgeon: Serena Colonel, MD;  Location: [facility];  Service: ENT;  Laterality: N/A;   Family History  Problem Relation Age of Onset  . Diabetes Other   . Hypertension Other    Social History  Substance Use Topics  . Smoking status: Never Smoker   . Smokeless tobacco: None  . Alcohol Use: No   OB History    No data available     Review of Systems Per HPI with all other pertinent systems negative.   Allergies  Penicillins  Home Medications   Prior to Admission medications   Medication Sig Start Date End Date Taking? Authorizing Provider  amitriptyline (ELAVIL) 50 MG tablet Take 50 mg by mouth at bedtime.   Yes Historical Provider, MD  cetirizine (ZYRTEC) 10 MG tablet Take 10 mg by mouth daily.   Yes Historical Provider, MD  zolpidem (AMBIEN) 10 MG tablet Take 10 mg by mouth at bedtime.    Yes Historical Provider, MD  diclofenac (VOLTAREN) 0.1 % ophthalmic solution Place 1 drop into the left eye 4 (four) times daily. 05/15/14   Rodolph Bong, MD  diclofenac  (VOLTAREN) 75 MG EC tablet Take 1 tablet (75 mg total) by mouth 2 (two) times daily. 12/13/14   Ozella Rocks, MD  ibuprofen (ADVIL,MOTRIN) 600 MG tablet Take 1 tablet (600 mg total) by mouth every 6 (six) hours as needed for pain. 05/24/12   Fayrene Helper, PA-C  Multiple Vitamin (MULTIVITAMIN WITH MINERALS) TABS Take 1 tablet by mouth daily.    Historical Provider, MD  promethazine (PHENERGAN) 25 MG suppository Place 1 suppository (25 mg total) rectally every 6 (six) hours as needed for nausea. 08/04/11 08/11/11  Serena Colonel, MD   BP 128/86 mmHg  Pulse 93  Temp(Src) 97.6 F (36.4 C) (Oral)  Resp 16  SpO2 100%  LMP 11/25/2014 (Exact Date) Physical Exam Physical Exam  Constitutional: oriented to person, place, and time. appears well-developed and well-nourished. No distress.  HENT:  Head: Normocephalic and atraumatic.  Eyes: EOMI. PERRL.  Neck: Normal range of motion.  Cardiovascular: RRR, no m/r/g, 2+ distal pulses,  Pulmonary/Chest: Effort normal and breath sounds normal. No respiratory distress.  Abdominal: Soft. Bowel sounds are normal. NonTTP, no distension.  Musculoskeletal: Left great toe tender to palpation along the dorsal aspect. Patient does not tolerate passive range of motion due to pain. Sensation intact. Less than 2 second cap refill..  Neurological: alert and oriented to person, place, and time.  Skin: Skin is warm. No rash noted. non diaphoretic.  Psychiatric:  normal mood and affect. behavior is normal. Judgment and thought content normal.   ED Course  Procedures (including critical care time) Labs Review Labs Reviewed - No data to display  Imaging Review Dg Toe Great Left  12/13/2014   CLINICAL DATA:  Tripped over paperwork.  Hurt great toe.  EXAM: LEFT GREAT TOE  COMPARISON:  None.  FINDINGS: A nondisplaced fracture is noted along the medial cortex of the first distal phalanx. No dislocation identified.  IMPRESSION: 1. Suspect nondisplaced fracture involving the distal  phalanx of the great toe.   Electronically Signed   By: Signa Kell M.D.   On: 12/13/2014 14:17     MDM   1. Fracture of great toe, left, closed, initial encounter    Fracture noted as above. Buddy tape and postop shoe. Start Voltaren. Ice elevation and return to normal activities as discussed over time.    Ozella Rocks, MD 12/13/14 [phone]

## 2014-12-13 NOTE — Discharge Instructions (Signed)
You fractured your big toe. Please use the postop shoe as discussed. He can buddy tape as needed for additional support relief. Use 1-2 Aleve twice a day or the Voltaren for pain and inflammation.

## 2015-01-24 ENCOUNTER — Other Ambulatory Visit: Payer: Self-pay

## 2015-01-24 DIAGNOSIS — Z1231 Encounter for screening mammogram for malignant neoplasm of breast: Secondary | ICD-10-CM

## 2015-01-31 ENCOUNTER — Encounter (HOSPITAL_COMMUNITY): Payer: Self-pay | Admitting: Emergency Medicine

## 2015-01-31 ENCOUNTER — Emergency Department (INDEPENDENT_AMBULATORY_CARE_PROVIDER_SITE_OTHER)
Admission: EM | Admit: 2015-01-31 | Discharge: 2015-01-31 | Disposition: A | Discharge status: Home / Self Care | Payer: Managed Care, Other (non HMO) | Source: Home / Self Care | Attending: Family Medicine | Admitting: Family Medicine

## 2015-01-31 DIAGNOSIS — Z9109 Other allergy status, other than to drugs and biological substances: Secondary | ICD-10-CM

## 2015-01-31 DIAGNOSIS — J324 Chronic pansinusitis: Secondary | ICD-10-CM

## 2015-01-31 DIAGNOSIS — J069 Acute upper respiratory infection, unspecified: Secondary | ICD-10-CM | POA: Diagnosis not present

## 2015-01-31 DIAGNOSIS — Z889 Allergy status to unspecified drugs, medicaments and biological substances status: Secondary | ICD-10-CM

## 2015-01-31 MED ORDER — FLUTICASONE PROPIONATE 50 MCG/ACT NA SUSP: 2.0000 | Freq: Every day | NASAL | Status: DC

## 2015-01-31 MED ORDER — IPRATROPIUM BROMIDE 0.06 % NA SOLN: 2.0000 | Freq: Four times a day (QID) | NASAL | Status: DC

## 2015-01-31 NOTE — Discharge Instructions (Signed)
Your symptoms are likely due to a viral illness. These typically improve within 7-10 days and rather worst from 3-5 days. To help with your symptoms please begin any or all of the following medications, nasal saline to 4 times per day to clear your nasal passages, nasal Atrovent to dry per nasal secretions, Flonase at night to help with allergies and sinus congestion, 1-2 Aleve twice a day or Excedrin, Mucinex to help break up mucus, Sudafed to dry up sinus congestion. If you develop severe worsening of your sinus pain and develop fevers greater than 100.4 or if your illness last greater than 7-10 days please let us know you'll likely need antibiotics at that point.

## 2015-01-31 NOTE — ED Notes (Signed)
Pt has been suffering from sinus pressure and congestion for 2-3 days.  Pt denies any fever.

## 2015-01-31 NOTE — ED Provider Notes (Signed)
CSN: [id number]     Arrival date & time 01/31/15  1308 History   First MD Initiated Contact with Patient 01/31/15 1416     Chief Complaint  Patient presents with  . Facial Pain  . Nasal Congestion   (Consider location/radiation/quality/duration/timing/severity/associated sxs/prior Treatment) HPI  3 days of facial pain and congestin. HA, and occasional cough.  OTC cold medicine w/o improveemnt.  Symptoms are intermittent but getting worse. Denies any chest pain, shortness of breath, nausea, vomiting, neck stiffness, chest pain, rash.   Past Medical History  Diagnosis Date  . Headache(784.0)   . GERD (gastroesophageal reflux disease)   . Seasonal allergies    Past Surgical History  Procedure Laterality Date  . Tubal ligation    . Cesarean section      X4  . Tonsillectomy  08/04/2011    Procedure: TONSILLECTOMY;  Surgeon: Serena Colonel, MD;  Location: [facility];  Service: ENT;  Laterality: N/A;   Family History  Problem Relation Age of Onset  . Diabetes Other   . Hypertension Other    Social History  Substance Use Topics  . Smoking status: Never Smoker   . Smokeless tobacco: None  . Alcohol Use: No   OB History    No data available     Review of Systems Per HPI with all other pertinent systems negative.    Allergies  Penicillins  Home Medications   Prior to Admission medications   Medication Sig Start Date End Date Taking? Authorizing Provider  amitriptyline (ELAVIL) 50 MG tablet Take 50 mg by mouth at bedtime.   Yes Historical Provider, MD  zolpidem (AMBIEN) 10 MG tablet Take 10 mg by mouth at bedtime.    Yes Historical Provider, MD  cetirizine (ZYRTEC) 10 MG tablet Take 10 mg by mouth daily.    Historical Provider, MD  diclofenac (VOLTAREN) 0.1 % ophthalmic solution Place 1 drop into the left eye 4 (four) times daily. 05/15/14   Rodolph Bong, MD  diclofenac (VOLTAREN) 75 MG EC tablet Take 1 tablet (75 mg total) by mouth 2 (two) times daily. 12/13/14    Ozella Rocks, MD  fluticasone (FLONASE) 50 MCG/ACT nasal spray Place 2 sprays into both nostrils at bedtime. 01/31/15   Ozella Rocks, MD  ibuprofen (ADVIL,MOTRIN) 600 MG tablet Take 1 tablet (600 mg total) by mouth every 6 (six) hours as needed for pain. 05/24/12   Fayrene Helper, PA-C  ipratropium (ATROVENT) 0.06 % nasal spray Place 2 sprays into both nostrils 4 (four) times daily. 01/31/15   Ozella Rocks, MD  Multiple Vitamin (MULTIVITAMIN WITH MINERALS) TABS Take 1 tablet by mouth daily.    Historical Provider, MD  promethazine (PHENERGAN) 25 MG suppository Place 1 suppository (25 mg total) rectally every 6 (six) hours as needed for nausea. 08/04/11 08/11/11  Serena Colonel, MD   Meds Ordered and Administered this Visit  Medications - No data to display  BP 117/81 mmHg  Pulse 102  Temp(Src) 98 F (36.7 C) (Oral)  Resp 16  SpO2 97%  LMP 01/15/2015 (Within Days) No data found.   Physical Exam Physical Exam  Constitutional: oriented to person, place, and time. appears well-developed and well-nourished. No distress.  HENT:  Head: Normocephalic and atraumatic.  Eyes: EOMI. PERRL.  Boggy nasal turbinates with left worse than right. Neck: Normal range of motion.  Cardiovascular: RRR, no m/r/g, 2+ distal pulses,  Pulmonary/Chest: Effort normal and breath sounds normal. No respiratory distress.  Abdominal: Soft. Bowel  sounds are normal. NonTTP, no distension.  Musculoskeletal: Normal range of motion. Non ttp, no effusion.  Neurological: alert and oriented to person, place, and time.  Skin: Skin is warm. No rash noted. non diaphoretic.  Psychiatric: normal mood and affect. behavior is normal. Judgment and thought content normal.   ED Course  Procedures (including critical care time)  Labs Review Labs Reviewed - No data to display  Imaging Review No results found.   Visual Acuity Review  Right Eye Distance:   Left Eye Distance:   Bilateral Distance:    Right Eye Near:   Left  Eye Near:    Bilateral Near:         MDM   1. Viral URI   2. Pansinusitis, unspecified chronicity   3. Multiple allergies    NSAIDs, nasal saline, Flonase, nasal Atrovent, nightly allergy medicine such as Zyrtec or Allegra, Mucinex. The need for antibiotic this time though if condition was greater than 7-10 days or patient developed worsening facial pain along with fevers I would recommend starting Augmentin.    Ozella Rocks, MD 01/31/15 [phone]

## 2015-04-10 ENCOUNTER — Ambulatory Visit: Payer: Managed Care, Other (non HMO)

## 2015-06-20 ENCOUNTER — Ambulatory Visit
Admission: RE | Admit: 2015-06-20 | Discharge: 2015-06-20 | Disposition: A | Discharge status: Home / Self Care | Payer: BC Managed Care – PPO | Source: Ambulatory Visit

## 2015-06-20 DIAGNOSIS — Z1231 Encounter for screening mammogram for malignant neoplasm of breast: Secondary | ICD-10-CM

## 2015-11-05 ENCOUNTER — Emergency Department (HOSPITAL_BASED_OUTPATIENT_CLINIC_OR_DEPARTMENT_OTHER)
Admission: EM | Admit: 2015-11-05 | Discharge: 2015-11-05 | Disposition: A | Discharge status: Home / Self Care | Payer: BC Managed Care – PPO | Attending: Emergency Medicine | Admitting: Emergency Medicine

## 2015-11-05 ENCOUNTER — Encounter (HOSPITAL_BASED_OUTPATIENT_CLINIC_OR_DEPARTMENT_OTHER): Payer: Self-pay | Admitting: *Deleted

## 2015-11-05 DIAGNOSIS — J029 Acute pharyngitis, unspecified: Secondary | ICD-10-CM | POA: Insufficient documentation

## 2015-11-05 LAB — RAPID STREP SCREEN (MED CTR MEBANE ONLY): Streptococcus, Group A Screen (Direct): NEGATIVE

## 2015-11-05 MED ORDER — PREDNISONE 10 MG PO TABS: 20.0000 mg | ORAL_TABLET | Freq: Two times a day (BID) | ORAL | Status: DC

## 2015-11-05 MED FILL — predniSONE 10 MG TABS: 10 | 3 days supply | Qty: 12 | Fill #0

## 2015-11-05 NOTE — ED Provider Notes (Signed)
CSN: [id number]     Arrival date & time 11/05/15  1106 History   First MD Initiated Contact with Patient 11/05/15 1304     Chief Complaint  Patient presents with  . Sore Throat     (Consider location/radiation/quality/duration/timing/severity/associated sxs/prior Treatment) HPI Comments: Patient presents with complaints of sore throat for the past 2 days. She has had strep throat in the past and suspects this has recurred. She is status post tonsillectomy 3 years ago.  Patient is a 45 y.o. female presenting with pharyngitis. The history is provided by the patient.  Sore Throat This is a new problem. The current episode started 2 days ago. The problem occurs constantly. The problem has been gradually worsening. Pertinent negatives include no chest pain and no shortness of breath. The symptoms are aggravated by swallowing. Nothing relieves the symptoms. She has tried nothing for the symptoms. The treatment provided no relief.    Past Medical History  Diagnosis Date  . Headache(784.0)   . GERD (gastroesophageal reflux disease)   . Seasonal allergies    Past Surgical History  Procedure Laterality Date  . Tubal ligation    . Cesarean section      X4  . Tonsillectomy  08/04/2011    Procedure: TONSILLECTOMY;  Surgeon: Serena ColonelJefry Rosen, MD;  Location: [facility];  Service: ENT;  Laterality: N/A;   Family History  Problem Relation Age of Onset  . Diabetes Other   . Hypertension Other    Social History  Substance Use Topics  . Smoking status: Never Smoker   . Smokeless tobacco: None  . Alcohol Use: No   OB History    No data available     Review of Systems  Respiratory: Negative for shortness of breath.   Cardiovascular: Negative for chest pain.  All other systems reviewed and are negative.     Allergies  Penicillins  Home Medications   Prior to Admission medications   Medication Sig Start Date End Date Taking? Authorizing Provider  amitriptyline (ELAVIL) 50  MG tablet Take 50 mg by mouth at bedtime.    Historical Provider, MD  cetirizine (ZYRTEC) 10 MG tablet Take 10 mg by mouth daily.    Historical Provider, MD  diclofenac (VOLTAREN) 0.1 % ophthalmic solution Place 1 drop into the left eye 4 (four) times daily. 05/15/14   Rodolph BongEvan S Corey, MD  diclofenac (VOLTAREN) 75 MG EC tablet Take 1 tablet (75 mg total) by mouth 2 (two) times daily. 12/13/14   Ozella Rocksavid J Merrell, MD  fluticasone (FLONASE) 50 MCG/ACT nasal spray Place 2 sprays into both nostrils at bedtime. 01/31/15   Ozella Rocksavid J Merrell, MD  ibuprofen (ADVIL,MOTRIN) 600 MG tablet Take 1 tablet (600 mg total) by mouth every 6 (six) hours as needed for pain. 05/24/12   Fayrene HelperBowie Tran, PA-C  ipratropium (ATROVENT) 0.06 % nasal spray Place 2 sprays into both nostrils 4 (four) times daily. 01/31/15   Ozella Rocksavid J Merrell, MD  Multiple Vitamin (MULTIVITAMIN WITH MINERALS) TABS Take 1 tablet by mouth daily.    Historical Provider, MD  predniSONE (DELTASONE) 10 MG tablet Take 2 tablets (20 mg total) by mouth 2 (two) times daily. 11/05/15   Geoffery Lyonsouglas Delo, MD  promethazine (PHENERGAN) 25 MG suppository Place 1 suppository (25 mg total) rectally every 6 (six) hours as needed for nausea. 08/04/11 08/11/11  Serena ColonelJefry Rosen, MD  zolpidem (AMBIEN) 10 MG tablet Take 10 mg by mouth at bedtime.     Historical Provider, MD   BP  121/89 mmHg  Pulse 86  Temp(Src) 98.5 F (36.9 C) (Oral)  Resp 18  Ht 5\' 4"  (1.626 m)  Wt 160 lb (72.576 kg)  BMI 27.45 kg/m2  SpO2 100% Physical Exam  Constitutional: She is oriented to person, place, and time. She appears well-developed and well-nourished. No distress.  HENT:  Head: Normocephalic and atraumatic.  Mouth/Throat: Oropharynx is clear and moist. No oropharyngeal exudate.  Neck: Normal range of motion. Neck supple.  Cardiovascular: Normal rate and regular rhythm.  Exam reveals no gallop and no friction rub.   No murmur heard. Pulmonary/Chest: Effort normal and breath sounds normal. No respiratory  distress. She has no wheezes.  Abdominal: Soft. Bowel sounds are normal. She exhibits no distension. There is no tenderness.  Musculoskeletal: Normal range of motion. She exhibits no edema.  Neurological: She is alert and oriented to person, place, and time.  Skin: Skin is warm and dry. She is not diaphoretic.  Nursing note and vitals reviewed.   ED Course  Procedures (including critical care time) Labs Review Labs Reviewed  RAPID STREP SCREEN (NOT AT [facility])  CULTURE, GROUP A STREP [facility])    Imaging Review No results found. I have personally reviewed and evaluated these images and lab results as part of my medical decision-making.   EKG Interpretation None      MDM   Final diagnoses:  Pharyngitis    Strep test is negative. She will be prescribed prednisone. Strep culture is pending area and if this turns positive she will be called and notified.    Geoffery Lyonsouglas Delo, MD 11/05/15 [phone]

## 2015-11-05 NOTE — Discharge Instructions (Signed)
Prednisone as prescribed.  Ibuprofen 600 mg every 6 hours as needed for pain.  We will call you if your cultures indicate you require further treatment.   Pharyngitis Pharyngitis is redness, pain, and swelling (inflammation) of your pharynx.  CAUSES  Pharyngitis is usually caused by infection. Most of the time, these infections are from viruses (viral) and are part of a cold. However, sometimes pharyngitis is caused by bacteria (bacterial). Pharyngitis can also be caused by allergies. Viral pharyngitis may be spread from person to person by coughing, sneezing, and personal items or utensils (cups, forks, spoons, toothbrushes). Bacterial pharyngitis may be spread from person to person by more intimate contact, such as kissing.  SIGNS AND SYMPTOMS  Symptoms of pharyngitis include:   Sore throat.   Tiredness (fatigue).   Low-grade fever.   Headache.  Joint pain and muscle aches.  Skin rashes.  Swollen lymph nodes.  Plaque-like film on throat or tonsils (often seen with bacterial pharyngitis). DIAGNOSIS  Your health care provider will ask you questions about your illness and your symptoms. Your medical history, along with a physical exam, is often all that is needed to diagnose pharyngitis. Sometimes, a rapid strep test is done. Other lab tests may also be done, depending on the suspected cause.  TREATMENT  Viral pharyngitis will usually get better in 3-4 days without the use of medicine. Bacterial pharyngitis is treated with medicines that kill germs (antibiotics).  HOME CARE INSTRUCTIONS   Drink enough water and fluids to keep your urine clear or pale yellow.   Only take over-the-counter or prescription medicines as directed by your health care provider:   If you are prescribed antibiotics, make sure you finish them even if you start to feel better.   Do not take aspirin.   Get lots of rest.   Gargle with 8 oz of salt water ( tsp of salt per 1 qt of water) as  often as every 1-2 hours to soothe your throat.   Throat lozenges (if you are not at risk for choking) or sprays may be used to soothe your throat. SEEK MEDICAL CARE IF:   You have large, tender lumps in your neck.  You have a rash.  You cough up green, yellow-brown, or bloody spit. SEEK IMMEDIATE MEDICAL CARE IF:   Your neck becomes stiff.  You drool or are unable to swallow liquids.  You vomit or are unable to keep medicines or liquids down.  You have severe pain that does not go away with the use of recommended medicines.  You have trouble breathing (not caused by a stuffy nose). MAKE SURE YOU:   Understand these instructions.  Will watch your condition.  Will get help right away if you are not doing well or get worse.   This information is not intended to replace advice given to you by your health care provider. Make sure you discuss any questions you have with your health care provider.   Document Released: 04/21/2005 Document Revised: 02/09/2013 Document Reviewed: 12/27/2012 Elsevier Interactive Patient Education [employer]2016 Elsevier Inc.

## 2015-11-05 NOTE — ED Notes (Signed)
Sore throat x 2 days. She thinks she has strep.

## 2015-11-07 LAB — CULTURE, GROUP A STREP (THRC)

## 2015-11-15 ENCOUNTER — Ambulatory Visit: Payer: Self-pay

## 2015-11-19 ENCOUNTER — Ambulatory Visit: Payer: Self-pay | Admitting: Obstetrics & Gynecology

## 2015-12-24 ENCOUNTER — Ambulatory Visit: Payer: BC Managed Care – PPO | Admitting: Obstetrics and Gynecology

## 2016-01-12 ENCOUNTER — Encounter: Payer: Self-pay | Admitting: *Deleted

## 2016-01-14 ENCOUNTER — Encounter: Payer: Self-pay | Admitting: Obstetrics

## 2016-01-14 ENCOUNTER — Ambulatory Visit (INDEPENDENT_AMBULATORY_CARE_PROVIDER_SITE_OTHER): Payer: BC Managed Care – PPO | Admitting: Obstetrics

## 2016-01-14 VITALS — BP 121/79 | HR 88 | Temp 98.0°F | Ht 64.0 in | Wt 165.0 lb

## 2016-01-14 DIAGNOSIS — Z124 Encounter for screening for malignant neoplasm of cervix: Secondary | ICD-10-CM

## 2016-01-14 DIAGNOSIS — Z01419 Encounter for gynecological examination (general) (routine) without abnormal findings: Secondary | ICD-10-CM | POA: Diagnosis not present

## 2016-01-14 NOTE — Progress Notes (Signed)
Subjective:        [name] is a 45 y.o. female here for a routine exam.  Current complaints: None.    Personal health questionnaire:  Is patient [name], have a family history of breast and/or ovarian cancer: no Is there a family history of uterine cancer diagnosed at age < 40, gastrointestinal cancer, urinary tract cancer, family member who is a Personnel officer syndrome-associated carrier: no Is the patient overweight and hypertensive, family history of diabetes, personal history of gestational diabetes, preeclampsia or PCOS: no Is patient over 90, have PCOS,  family history of premature CHD under age 19, diabetes, smoke, have hypertension or peripheral artery disease:  no At any time, has a partner hit, kicked or otherwise hurt or frightened you?: no Over the past 2 weeks, have you felt down, depressed or hopeless?: no Over the past 2 weeks, have you felt little interest or pleasure in doing things?:no   Gynecologic History Patient's last menstrual period was 01/03/2016 (approximate). Contraception: tubal ligation Last Pap: 2016. Results were: normal Last mammogram: 2016. Results were: normal  Obstetric History OB History  Gravida Para Term Preterm AB Living  5       1 5   SAB TAB Ectopic Multiple Live Births    1 0 0 5    # Outcome Date GA Lbr Len/2nd Weight Sex Delivery Anes PTL Lv  5 Gravida           4 Gravida           3 Gravida           2 Gravida           1 TAB               Past Medical History:  Diagnosis Date  . GERD (gastroesophageal reflux disease)   . Headache(784.0)   . Seasonal allergies     Past Surgical History:  Procedure Laterality Date  . CESAREAN SECTION     X4  . TONSILLECTOMY  08/04/2011   Procedure: TONSILLECTOMY;  Surgeon: Serena Colonel, MD;  Location: [facility];  Service: ENT;  Laterality: N/A;  . TUBAL LIGATION       Current Outpatient Prescriptions:  .  amitriptyline (ELAVIL) 50 MG tablet, Take 50 mg by mouth  at bedtime., Disp: , Rfl:  .  cetirizine (ZYRTEC) 10 MG tablet, Take 10 mg by mouth daily., Disp: , Rfl:  .  Multiple Vitamin (MULTIVITAMIN WITH MINERALS) TABS, Take 1 tablet by mouth daily., Disp: , Rfl:  .  zolpidem (AMBIEN) 10 MG tablet, Take 10 mg by mouth at bedtime. , Disp: , Rfl:  .  diclofenac (VOLTAREN) 0.1 % ophthalmic solution, Place 1 drop into the left eye 4 (four) times daily., Disp: 5 mL, Rfl: 0 .  diclofenac (VOLTAREN) 75 MG EC tablet, Take 1 tablet (75 mg total) by mouth 2 (two) times daily., Disp: 60 tablet, Rfl: 0 .  fluticasone (FLONASE) 50 MCG/ACT nasal spray, Place 2 sprays into both nostrils at bedtime. (Patient not taking: Reported on 01/14/2016), Disp: 16 g, Rfl: 0 .  ibuprofen (ADVIL,MOTRIN) 600 MG tablet, Take 1 tablet (600 mg total) by mouth every 6 (six) hours as needed for pain. (Patient not taking: Reported on 01/14/2016), Disp: 20 tablet, Rfl: 0 .  ipratropium (ATROVENT) 0.06 % nasal spray, Place 2 sprays into both nostrils 4 (four) times daily. (Patient not taking: Reported on 01/14/2016), Disp: 15 mL, Rfl: 12 .  predniSONE (DELTASONE)  10 MG tablet, Take 2 tablets (20 mg total) by mouth 2 (two) times daily. (Patient not taking: Reported on 01/14/2016), Disp: 12 tablet, Rfl: 0 .  promethazine (PHENERGAN) 25 MG suppository, Place 1 suppository (25 mg total) rectally every 6 (six) hours as needed for nausea., Disp: 12 each, Rfl: 0 Allergies  Allergen Reactions  . Penicillins Rash    Social History  Substance Use Topics  . Smoking status: Never Smoker  . Smokeless tobacco: Not on file  . Alcohol use No    Family History  Problem Relation Age of Onset  . Diabetes Other   . Hypertension Other       Review of Systems  Constitutional: negative for fatigue and weight loss Respiratory: negative for cough and wheezing Cardiovascular: negative for chest pain, fatigue and palpitations Gastrointestinal: negative for abdominal pain and change in bowel  habits Musculoskeletal:negative for myalgias Neurological: negative for gait problems and tremors Behavioral/Psych: negative for abusive relationship, depression Endocrine: negative for temperature intolerance   Genitourinary:negative for abnormal menstrual periods, genital lesions, hot flashes, sexual problems and vaginal discharge Integument/breast: negative for breast lump, breast tenderness, nipple discharge and skin lesion(s)    Objective:       BP 121/79   Pulse 88   Temp 98 F (36.7 C)   Ht 5\' 4"  (1.626 m)   Wt 165 lb (74.8 kg)   LMP 01/03/2016 (Approximate)   BMI 28.32 kg/m  General:   alert  Skin:   no rash or abnormalities  Lungs:   clear to auscultation bilaterally  Heart:   regular rate and rhythm, S1, S2 normal, no murmur, click, rub or gallop  Breasts:   normal without suspicious masses, skin or nipple changes or axillary nodes  Abdomen:  normal findings: no organomegaly, soft, non-tender and no hernia  Pelvis:  External genitalia: normal general appearance Urinary system: urethral meatus normal and bladder without fullness, nontender Vaginal: normal without tenderness, induration or masses Cervix: normal appearance Adnexa: normal bimanual exam Uterus: anteverted and non-tender, normal size   Lab Review Urine pregnancy test Labs reviewed yes Radiologic studies reviewed yes  50% of 20 min visit spent on counseling and coordination of care.   Assessment:    Healthy female exam.    Plan:    Education reviewed: calcium supplements, low fat, low cholesterol diet, self breast exams and weight bearing exercise. Follow up in: 1 year.   No orders of the defined types were placed in this encounter.  Orders Placed This Encounter  Procedures  . NuSwab Vaginitis (VG)     Patient ID: Cletis Mediaravon L Harold, female   DOB: 1971/04/08, 45 y.o.   MRN: [id number]

## 2016-01-17 LAB — NUSWAB VAGINITIS (VG)
Candida albicans, NAA: NEGATIVE
Candida glabrata, NAA: NEGATIVE
Trich vag by NAA: NEGATIVE

## 2016-01-18 LAB — HPV, LOW VOLUME (REFLEX): HPV low volume reflex: NEGATIVE

## 2016-01-18 LAB — PAP IG AND HPV HIGH-RISK: PAP Smear Comment: 0

## 2016-05-15 ENCOUNTER — Other Ambulatory Visit: Payer: Self-pay | Admitting: Internal Medicine

## 2016-05-15 DIAGNOSIS — Z1231 Encounter for screening mammogram for malignant neoplasm of breast: Secondary | ICD-10-CM

## 2016-06-20 ENCOUNTER — Ambulatory Visit
Admission: RE | Admit: 2016-06-20 | Discharge: 2016-06-20 | Disposition: A | Discharge status: Home / Self Care | Payer: BC Managed Care – PPO | Source: Ambulatory Visit | Attending: Internal Medicine | Admitting: Internal Medicine

## 2016-06-20 DIAGNOSIS — Z1231 Encounter for screening mammogram for malignant neoplasm of breast: Secondary | ICD-10-CM

## 2017-01-14 ENCOUNTER — Ambulatory Visit: Payer: Self-pay | Admitting: Obstetrics

## 2017-01-29 ENCOUNTER — Ambulatory Visit (INDEPENDENT_AMBULATORY_CARE_PROVIDER_SITE_OTHER): Payer: BC Managed Care – PPO | Admitting: Obstetrics & Gynecology

## 2017-01-29 ENCOUNTER — Ambulatory Visit: Payer: BC Managed Care – PPO | Admitting: Obstetrics & Gynecology

## 2017-01-29 ENCOUNTER — Encounter: Payer: Self-pay | Admitting: Obstetrics & Gynecology

## 2017-01-29 VITALS — BP 130/82 | HR 92 | Ht 64.0 in | Wt 170.0 lb

## 2017-01-29 DIAGNOSIS — N951 Menopausal and female climacteric states: Secondary | ICD-10-CM

## 2017-01-29 DIAGNOSIS — Z01419 Encounter for gynecological examination (general) (routine) without abnormal findings: Secondary | ICD-10-CM | POA: Diagnosis not present

## 2017-01-29 DIAGNOSIS — Z3202 Encounter for pregnancy test, result negative: Secondary | ICD-10-CM

## 2017-01-29 LAB — POCT URINE PREGNANCY: Preg Test, Ur: NEGATIVE

## 2017-01-29 NOTE — Progress Notes (Signed)
Patient ID: Cletis Media, female   DOB: [birth year], 46 y.o.   MRN: [id number]  Chief Complaint  Patient presents with  . Gynecologic Exam    pt states that she has not yet had cycle for this month and is concerned about menopuase.  Annual exam  HPI [name] is a 46 y.o. female.  [id number] Patient's last menstrual period was 12/03/2016. She has VMS and no menses for 8 weeks. S/P BTL HPI  Past Medical History:  Diagnosis Date  . GERD (gastroesophageal reflux disease)   . Headache(784.0)   . Seasonal allergies     Past Surgical History:  Procedure Laterality Date  . CESAREAN SECTION     X4  . TONSILLECTOMY  08/04/2011   Procedure: TONSILLECTOMY;  Surgeon: Serena Colonel, MD;  Location: [facility];  Service: ENT;  Laterality: N/A;  . TUBAL LIGATION      Family History  Problem Relation Age of Onset  . Diabetes Other   . Hypertension Other     Social History Social History  Substance Use Topics  . Smoking status: Never Smoker  . Smokeless tobacco: Never Used  . Alcohol use No    Allergies  Allergen Reactions  . Penicillins Rash    Current Outpatient Prescriptions  Medication Sig Dispense Refill  . amitriptyline (ELAVIL) 50 MG tablet Take 50 mg by mouth at bedtime.    . cetirizine (ZYRTEC) 10 MG tablet Take 10 mg by mouth daily.    . ranitidine (ZANTAC) 150 MG tablet Take 150 mg by mouth 2 (two) times daily.    Marland Kitchen zolpidem (AMBIEN) 10 MG tablet Take 10 mg by mouth at bedtime.     . diclofenac (VOLTAREN) 0.1 % ophthalmic solution Place 1 drop into the left eye 4 (four) times daily. (Patient not taking: Reported on 01/29/2017) 5 mL 0  . diclofenac (VOLTAREN) 75 MG EC tablet Take 1 tablet (75 mg total) by mouth 2 (two) times daily. (Patient not taking: Reported on 01/29/2017) 60 tablet 0  . fluticasone (FLONASE) 50 MCG/ACT nasal spray Place 2 sprays into both nostrils at bedtime. (Patient not taking: Reported on 01/14/2016) 16 g 0  . ibuprofen  (ADVIL,MOTRIN) 600 MG tablet Take 1 tablet (600 mg total) by mouth every 6 (six) hours as needed for pain. (Patient not taking: Reported on 01/14/2016) 20 tablet 0  . ipratropium (ATROVENT) 0.06 % nasal spray Place 2 sprays into both nostrils 4 (four) times daily. (Patient not taking: Reported on 01/14/2016) 15 mL 12  . Multiple Vitamin (MULTIVITAMIN WITH MINERALS) TABS Take 1 tablet by mouth daily.    . predniSONE (DELTASONE) 10 MG tablet Take 2 tablets (20 mg total) by mouth 2 (two) times daily. (Patient not taking: Reported on 01/14/2016) 12 tablet 0  . promethazine (PHENERGAN) 25 MG suppository Place 1 suppository (25 mg total) rectally every 6 (six) hours as needed for nausea. 12 each 0   No current facility-administered medications for this visit.     Review of Systems Review of Systems  Constitutional: Positive for unexpected weight change (10 lb).  Gastrointestinal: Negative.   Genitourinary: Positive for menstrual problem. Negative for dysuria, vaginal bleeding and vaginal discharge.    Blood pressure 130/82, pulse 92, height  (1.626 m), weight 170 lb (77.1 kg), last menstrual period 12/03/2016.  Physical Exam Physical Exam  Constitutional: She appears well-developed. No distress.  Neck: Normal range of motion. No thyromegaly present.  Cardiovascular: Normal rate.   Pulmonary/Chest: Effort normal.  No respiratory distress.  Abdominal: Soft. She exhibits no distension. There is no tenderness.  Genitourinary:  Genitourinary Comments: Pap is up to date so pelvic deferred  Skin: Skin is warm and dry.  Psychiatric: She has a normal mood and affect. Her behavior is normal.  Breasts: breasts appear normal, no suspicious masses, no skin or nipple changes or axillary nodes.   Data Reviewed Mammogram and pap result  Assessment    Well woman exam Suspect perimenopausal Moderate weight gain  Plan    Annual mammogram Pap in 18 mo. To 3.5 years   FSH and TSH to evaluate  recent menstrual disturbance     Scheryl Darter 01/29/2017, 3:09 PM

## 2017-01-29 NOTE — Patient Instructions (Signed)
Perimenopause Perimenopause is the time when your body begins to move into the menopause (no menstrual period for 12 straight months). It is a natural process. Perimenopause can begin 2-8 years before the menopause and usually lasts for 1 year after the menopause. During this time, your ovaries may or may not produce an egg. The ovaries vary in their production of estrogen and progesterone hormones each month. This can cause irregular menstrual periods, difficulty getting pregnant, vaginal bleeding between periods, and uncomfortable symptoms. What are the causes?  Irregular production of the ovarian hormones, estrogen and progesterone, and not ovulating every month. Other causes include:  Tumor of the pituitary gland in the brain.  Medical disease that affects the ovaries.  Radiation treatment.  Chemotherapy.  Unknown causes.  Heavy smoking and excessive alcohol intake can bring on perimenopause sooner. What are the signs or symptoms?  Hot flashes.  Night sweats.  Irregular menstrual periods.  Decreased sex drive.  Vaginal dryness.  Headaches.  Mood swings.  Depression.  Memory problems.  Irritability.  Tiredness.  Weight gain.  Trouble getting pregnant.  The beginning of losing bone cells (osteoporosis).  The beginning of hardening of the arteries (atherosclerosis). How is this diagnosed? Your health care provider will make a diagnosis by analyzing your age, menstrual history, and symptoms. He or she will do a physical exam and note any changes in your body, especially your female organs. Female hormone tests may or may not be helpful depending on the amount of female hormones you produce and when you produce them. However, other hormone tests may be helpful to rule out other problems. How is this treated? In some cases, no treatment is needed. The decision on whether treatment is necessary during the perimenopause should be made by you and your health care  provider based on how the symptoms are affecting you and your lifestyle. Various treatments are available, such as:  Treating individual symptoms with a specific medicine for that symptom.  Herbal medicines that can help specific symptoms.  Counseling.  Group therapy. Follow these instructions at home:  Keep track of your menstrual periods (when they occur, how heavy they are, how long between periods, and how long they last) as well as your symptoms and when they started.  Only take over-the-counter or prescription medicines as directed by your health care provider.  Sleep and rest.  Exercise.  Eat a diet that contains calcium (good for your bones) and soy (acts like the estrogen hormone).  Do not smoke.  Avoid alcoholic beverages.  Take vitamin supplements as recommended by your health care provider. Taking vitamin E may help in certain cases.  Take calcium and vitamin D supplements to help prevent bone loss.  Group therapy is sometimes helpful.  Acupuncture may help in some cases. Contact a health care provider if:  You have questions about any symptoms you are having.  You need a referral to a specialist (gynecologist, psychiatrist, or psychologist). Get help right away if:  You have vaginal bleeding.  Your period lasts longer than 8 days.  Your periods are recurring sooner than 21 days.  You have bleeding after intercourse.  You have severe depression.  You have pain when you urinate.  You have severe headaches.  You have vision problems. This information is not intended to replace advice given to you by your health care provider. Make sure you discuss any questions you have with your health care provider. Document Released: 05/29/2004 Document Revised: 09/27/2015 Document Reviewed: 11/18/2012 Elsevier Interactive   Patient Education  2017 Elsevier Inc.  

## 2017-01-30 LAB — FOLLICLE STIMULATING HORMONE: FSH: 29.9 m[IU]/mL

## 2017-01-30 LAB — TSH: TSH: 1.56 u[IU]/mL (ref 0.450–4.500)

## 2017-02-02 ENCOUNTER — Telehealth: Payer: Self-pay

## 2017-02-02 NOTE — Telephone Encounter (Signed)
Returned call and advised pt about lab results

## 2017-02-07 ENCOUNTER — Emergency Department (HOSPITAL_COMMUNITY)
Admission: EM | Admit: 2017-02-07 | Discharge: 2017-02-07 | Disposition: A | Discharge status: Home / Self Care | Payer: BC Managed Care – PPO | Attending: Emergency Medicine | Admitting: Emergency Medicine

## 2017-02-07 ENCOUNTER — Emergency Department (HOSPITAL_COMMUNITY): Payer: BC Managed Care – PPO

## 2017-02-07 ENCOUNTER — Encounter (HOSPITAL_COMMUNITY): Payer: Self-pay | Admitting: Emergency Medicine

## 2017-02-07 DIAGNOSIS — R05 Cough: Secondary | ICD-10-CM | POA: Diagnosis present

## 2017-02-07 DIAGNOSIS — R071 Chest pain on breathing: Secondary | ICD-10-CM | POA: Insufficient documentation

## 2017-02-07 DIAGNOSIS — Z79899 Other long term (current) drug therapy: Secondary | ICD-10-CM | POA: Insufficient documentation

## 2017-02-07 DIAGNOSIS — J069 Acute upper respiratory infection, unspecified: Secondary | ICD-10-CM

## 2017-02-07 MED ORDER — DIPHENHYDRAMINE HCL 25 MG PO CAPS
25.0000 mg | ORAL_CAPSULE | Freq: Once | ORAL | Status: AC
  Administered 2017-02-07: 25 mg via ORAL
  Filled 2017-02-07: qty 1

## 2017-02-07 MED ORDER — HYDROCODONE-ACETAMINOPHEN 5-325 MG PO TABS
1.0000 | ORAL_TABLET | Freq: Once | ORAL | Status: AC
  Administered 2017-02-07: 1 via ORAL
  Filled 2017-02-07: qty 1

## 2017-02-07 MED ORDER — HYDROCODONE-ACETAMINOPHEN 5-325 MG PO TABS: ORAL_TABLET | ORAL | 0 refills | Status: DC

## 2017-02-07 NOTE — ED Triage Notes (Signed)
Pt reports 2 day hx of sinus pressure, cough, sore throat. Tx with Zyrtec x 2. Temp 99.1 po this am. Denies NVD. No wheezing noted.

## 2017-02-07 NOTE — Discharge Instructions (Signed)
Return to the emergency room for any worsening or concerning symptoms including fast breathing, heart racing, confusion, vomiting. ° °Rest, cover your mouth when you cough and wash your hands frequently.  ° °Push fluids: water or Gatorade, do not drink any soda, juice or caffeinated beverages. ° °For fever and pain control you can take Motrin (ibuprofen) as follows: 400 mg (this is normally 2 over the counter pills) every 4 hours with food. ° °Do not return to work until a day after your fever breaks.  ° °Take Vicodin for cough and pain control, do not drink alcohol, drive, care for children or do other critical tasks while taking Vicodin    ° °Please follow with your primary care doctor in the next 2 days for a check-up. They must obtain records for further management.  ° °Do not hesitate to return to the Emergency Department for any new, worsening or concerning symptoms.  ° ° °

## 2017-02-07 NOTE — ED Notes (Signed)
Bed: WTR7 Expected date:  Expected time:  Means of arrival:  Comments: 

## 2017-02-07 NOTE — ED Provider Notes (Signed)
[facility]-EMERGENCY DEPT Provider Note   CSN: [id number] Arrival date & time: 02/07/17  1148     History   Chief Complaint Chief Complaint  Patient presents with  . URI  . Cough  . Nasal Congestion    HPI   Blood pressure 125/79, pulse 99, temperature 99.5 F (37.5 C), temperature source Oral, resp. rate 20, weight 76.7 kg (169 lb), last menstrual period 12/17/2016, SpO2 100 %.  [name] is a 46 y.o. female with no significant past medical history complaining of low-grade fever, rhinorrhea, sore throat, productive cough with pleuritic chest pain onset 3 days ago. No sick contacts have her patient does work in a school. She denies fever, nausea vomiting, otalgia, cervicalgia, myalgia, change in bowel or bladder habits, shortness of breath. On review of systems she endorses general fatigue with decreased by mouth intake. She states that the chest pain is bothering her the most and it is only when she coughs or takes a deep breath. No history of asthma, DVT, PE.  Past Medical History:  Diagnosis Date  . GERD (gastroesophageal reflux disease)   . Headache(784.0)   . Seasonal allergies     There are no active problems to display for this patient.   Past Surgical History:  Procedure Laterality Date  . CESAREAN SECTION     X4  . kidney stones    . TONSILLECTOMY  08/04/2011   Procedure: TONSILLECTOMY;  Surgeon: Serena Colonel, MD;  Location: [facility];  Service: ENT;  Laterality: N/A;  . TONSILLECTOMY    . TUBAL LIGATION      OB History    Gravida Para Term Preterm AB Living   SAB TAB Ectopic Multiple Live Births     1 0 0 5       Home Medications    Prior to Admission medications   Medication Sig Start Date End Date Taking? Authorizing Provider  amitriptyline (ELAVIL) 50 MG tablet Take 50 mg by mouth at bedtime.    [provider]  cetirizine (ZYRTEC) 10 MG tablet Take 10 mg by mouth daily.    [provider]    diclofenac (VOLTAREN) 0.1 % ophthalmic solution Place 1 drop into the left eye 4 (four) times daily. Patient not taking: Reported on 01/29/2017 05/15/14   Rodolph Bong, MD  diclofenac (VOLTAREN) 75 MG EC tablet Take 1 tablet (75 mg total) by mouth 2 (two) times daily. Patient not taking: Reported on 01/29/2017 12/13/14   Ozella Rocks, MD  fluticasone [facility]) 50 MCG/ACT nasal spray Place 2 sprays into both nostrils at bedtime. Patient not taking: Reported on 01/14/2016 01/31/15   Ozella Rocks, MD  ibuprofen (ADVIL,MOTRIN) 600 MG tablet Take 1 tablet (600 mg total) by mouth every 6 (six) hours as needed for pain. Patient not taking: Reported on 01/14/2016 05/24/12   Fayrene Helper, PA-C  ipratropium (ATROVENT) 0.06 % nasal spray Place 2 sprays into both nostrils 4 (four) times daily. Patient not taking: Reported on 01/14/2016 01/31/15   Ozella Rocks, MD  Multiple Vitamin (MULTIVITAMIN WITH MINERALS) TABS Take 1 tablet by mouth daily.    [provider]  predniSONE (DELTASONE) 10 MG tablet Take 2 tablets (20 mg total) by mouth 2 (two) times daily. Patient not taking: Reported on 01/14/2016 11/05/15   Geoffery Lyons, MD  promethazine (PHENERGAN) 25 MG suppository Place 1 suppository (25 mg total) rectally every 6 (six) hours  as needed for nausea. 08/04/11 08/11/11  Serena Colonel, MD  ranitidine (ZANTAC) 150 MG tablet Take 150 mg by mouth 2 (two) times daily.    [provider]  zolpidem (AMBIEN) 10 MG tablet Take 10 mg by mouth at bedtime.     [provider]    Family History Family History  Problem Relation Age of Onset  . Diabetes Other   . Hypertension Other   . Cancer Mother   . Hypertension Father   . Diabetes Father   . Heart failure Father     Social History Social History  Substance Use Topics  . Smoking status: Never Smoker  . Smokeless tobacco: Never Used  . Alcohol use No     Allergies   Penicillins   Review of Systems Review of Systems  A  complete review of systems was obtained and all systems are negative except as noted in the HPI and PMH.   Physical Exam Updated Vital Signs BP 125/79 (BP Location: Right Arm)   Pulse 99   Temp 99.5 F (37.5 C) (Oral)   Resp 20   Wt 76.7 kg (169 lb)   LMP 12/17/2016 (Approximate)   SpO2 100%   BMI 29.01 kg/m   Physical Exam   ED Treatments / Results  Labs (all labs ordered are listed, but only abnormal results are displayed) Labs Reviewed - No data to display  EKG  EKG Interpretation None       Radiology No results found.  Procedures Procedures (including critical care time)  Medications Ordered in ED Medications  HYDROcodone-acetaminophen (NORCO/VICODIN) 5-325 MG per tablet 1 tablet (not administered)     Initial Impression / Assessment and Plan / ED Course  I have reviewed the triage vital signs and the nursing notes.  Pertinent labs & imaging results that were available during my care of the patient were reviewed by me and considered in my medical decision making (see chart for details).    Vitals:   02/07/17 1203 02/07/17 1205  BP: 125/79   Pulse: 99   Resp: 20   Temp: 99.5 F (37.5 C)   TempSrc: Oral   SpO2: 100%   Weight:  76.7 kg (169 lb)    Medications  HYDROcodone-acetaminophen (NORCO/VICODIN) 5-325 MG per tablet 1 tablet (1 tablet Oral Given 02/07/17 1301)  diphenhydrAMINE (BENADRYL) capsule 25 mg (25 mg Oral Given 02/07/17 1301)    [name] is 46 y.o. female presenting with influenza-like illness onset 3 days ago she also has some pleuritic chest pain with this, lung sounds are clear however of concern for upper cystic bacterial infection. We'll obtain chest x-ray.  Chest x-ray without infiltrate, likely viral illness. Will treat symptomatically, recommend aggressive hydration, close follow with primary care  Evaluation does not show pathology that would require ongoing emergent intervention or inpatient treatment. Pt is  hemodynamically stable and mentating appropriately. Discussed findings and plan with patient/guardian, who agrees with care plan. All questions answered. Return precautions discussed and outpatient follow up given.      Final Clinical Impressions(s) / ED Diagnoses   Final diagnoses:  None    New Prescriptions New Prescriptions   No medications on file     Kaylyn Lim 02/07/17 1439    Linwood Dibbles, MD 02/08/17 [phone]

## 2017-06-03 ENCOUNTER — Other Ambulatory Visit: Payer: Self-pay | Admitting: Internal Medicine

## 2017-06-03 DIAGNOSIS — Z1231 Encounter for screening mammogram for malignant neoplasm of breast: Secondary | ICD-10-CM

## 2017-06-26 ENCOUNTER — Ambulatory Visit: Payer: BC Managed Care – PPO

## 2017-07-08 ENCOUNTER — Other Ambulatory Visit: Payer: Self-pay | Admitting: Internal Medicine

## 2017-07-09 ENCOUNTER — Other Ambulatory Visit: Payer: Self-pay | Admitting: Internal Medicine

## 2017-07-13 ENCOUNTER — Ambulatory Visit
Admission: RE | Admit: 2017-07-13 | Discharge: 2017-07-13 | Disposition: A | Discharge status: Home / Self Care | Payer: BC Managed Care – PPO | Source: Ambulatory Visit | Attending: Internal Medicine | Admitting: Internal Medicine

## 2017-07-13 DIAGNOSIS — Z1231 Encounter for screening mammogram for malignant neoplasm of breast: Secondary | ICD-10-CM

## 2017-07-16 ENCOUNTER — Ambulatory Visit: Payer: BC Managed Care – PPO

## 2018-02-10 ENCOUNTER — Ambulatory Visit (INDEPENDENT_AMBULATORY_CARE_PROVIDER_SITE_OTHER): Payer: BC Managed Care – PPO | Admitting: Obstetrics

## 2018-02-10 ENCOUNTER — Encounter: Payer: Self-pay | Admitting: Obstetrics

## 2018-02-10 VITALS — BP 120/80 | HR 77 | Ht 65.0 in | Wt 169.2 lb

## 2018-02-10 DIAGNOSIS — Z1151 Encounter for screening for human papillomavirus (HPV): Secondary | ICD-10-CM

## 2018-02-10 DIAGNOSIS — N898 Other specified noninflammatory disorders of vagina: Secondary | ICD-10-CM

## 2018-02-10 DIAGNOSIS — Z124 Encounter for screening for malignant neoplasm of cervix: Secondary | ICD-10-CM | POA: Diagnosis not present

## 2018-02-10 DIAGNOSIS — Z01419 Encounter for gynecological examination (general) (routine) without abnormal findings: Secondary | ICD-10-CM | POA: Diagnosis not present

## 2018-02-10 DIAGNOSIS — K581 Irritable bowel syndrome with constipation: Secondary | ICD-10-CM

## 2018-02-10 MED ORDER — DICYCLOMINE HCL 20 MG PO TABS: 20.0000 mg | ORAL_TABLET | Freq: Three times a day (TID) | ORAL | 5 refills | Status: DC

## 2018-02-10 NOTE — Progress Notes (Signed)
Patient is in the office for annual, last pap 01-14-16, last mammogram  07-13-17. Hx of tubal.

## 2018-02-10 NOTE — Progress Notes (Signed)
Subjective:        [name] is a 47 y.o. female here for a routine exam.  Current complaints: Lower abdominal pain and constipation.  Sometimes very painful to have BM's.  Personal health questionnaire:  Is patient [name], have a family history of breast and/or ovarian cancer: no Is there a family history of uterine cancer diagnosed at age < 6, gastrointestinal cancer, urinary tract cancer, family member who is a Personnel officer syndrome-associated carrier: no Is the patient overweight and hypertensive, family history of diabetes, personal history of gestational diabetes, preeclampsia or PCOS: no Is patient over 74, have PCOS,  family history of premature CHD under age 30, diabetes, smoke, have hypertension or peripheral artery disease:  no At any time, has a partner hit, kicked or otherwise hurt or frightened you?: no Over the past 2 weeks, have you felt down, depressed or hopeless?: no Over the past 2 weeks, have you felt little interest or pleasure in doing things?:no   Gynecologic History No LMP recorded. Patient is perimenopausal. Contraception: tubal ligation Last Pap: 2017. Results were: normal Last mammogram: 2019. Results were: normal  Obstetric History OB History  Gravida Para Term Preterm AB Living  5 3 2 1 1 5   SAB TAB Ectopic Multiple Live Births    1 0 1 5    # Outcome Date GA Lbr Len/2nd Weight Sex Delivery Anes PTL Lv  5 Term 10/07/04     CS-LTranv   LIV  4A Gravida 01/03/02     CS-LTranv   LIV  4B Preterm 01/13/02     CS-LTranv   LIV  3 Term 01/09/00     CS-LTranv   LIV  2 Gravida 06/08/91     CS-LTranv   LIV  1 TAB             Past Medical History:  Diagnosis Date  . GERD (gastroesophageal reflux disease)   . Headache(784.0)   . Seasonal allergies     Past Surgical History:  Procedure Laterality Date  . CESAREAN SECTION     X4  . kidney stones    . TONSILLECTOMY  08/04/2011   Procedure: TONSILLECTOMY;  Surgeon: Samantha Colonel, MD;  Location:  [facility];  Service: ENT;  Laterality: N/A;  . TONSILLECTOMY    . TUBAL LIGATION       Current Outpatient Medications:  .  amitriptyline (ELAVIL) 50 MG tablet, Take 50 mg by mouth at bedtime., Disp: , Rfl:  .  ranitidine (ZANTAC) 150 MG tablet, Take 150 mg by mouth 2 (two) times daily., Disp: , Rfl:  .  zolpidem (AMBIEN) 10 MG tablet, Take 10 mg by mouth at bedtime. , Disp: , Rfl:  .  cetirizine (ZYRTEC) 10 MG tablet, Take 10 mg by mouth daily., Disp: , Rfl:  .  diclofenac (VOLTAREN) 0.1 % ophthalmic solution, Place 1 drop into the left eye 4 (four) times daily. (Patient not taking: Reported on 01/29/2017), Disp: 5 mL, Rfl: 0 .  diclofenac (VOLTAREN) 75 MG EC tablet, Take 1 tablet (75 mg total) by mouth 2 (two) times daily. (Patient not taking: Reported on 01/29/2017), Disp: 60 tablet, Rfl: 0 .  dicyclomine (BENTYL) 20 MG tablet, Take 1 tablet (20 mg total) by mouth 3 (three) times daily before meals., Disp: 90 tablet, Rfl: 5 .  fluticasone (FLONASE) 50 MCG/ACT nasal spray, Place 2 sprays into both nostrils at bedtime. (Patient not taking: Reported on 01/14/2016), Disp: 16 g, Rfl: 0 .  HYDROcodone-acetaminophen (NORCO/VICODIN) 5-325 MG tablet, Take 1-2 tablets by mouth every 6 hours as needed for pain and/or cough. (Patient not taking: Reported on 02/10/2018), Disp: 11 tablet, Rfl: 0 .  ibuprofen (ADVIL,MOTRIN) 600 MG tablet, Take 1 tablet (600 mg total) by mouth every 6 (six) hours as needed for pain. (Patient not taking: Reported on 01/14/2016), Disp: 20 tablet, Rfl: 0 .  ipratropium (ATROVENT) 0.06 % nasal spray, Place 2 sprays into both nostrils 4 (four) times daily. (Patient not taking: Reported on 01/14/2016), Disp: 15 mL, Rfl: 12 .  Multiple Vitamin (MULTIVITAMIN WITH MINERALS) TABS, Take 1 tablet by mouth daily., Disp: , Rfl:  .  predniSONE (DELTASONE) 10 MG tablet, Take 2 tablets (20 mg total) by mouth 2 (two) times daily. (Patient not taking: Reported on 01/14/2016), Disp: 12  tablet, Rfl: 0 .  promethazine (PHENERGAN) 25 MG suppository, Place 1 suppository (25 mg total) rectally every 6 (six) hours as needed for nausea., Disp: 12 each, Rfl: 0 Allergies  Allergen Reactions  . Vicodin [Hydrocodone-Acetaminophen] Itching  . Penicillins Rash    Social History   Tobacco Use  . Smoking status: Never Smoker  . Smokeless tobacco: Never Used  Substance Use Topics  . Alcohol use: No    Family History  Problem Relation Age of Onset  . Diabetes Other   . Hypertension Other   . Cancer Mother   . Hypertension Father   . Diabetes Father   . Heart failure Father       Review of Systems  Constitutional: negative for fatigue and weight loss Respiratory: negative for cough and wheezing Cardiovascular: negative for chest pain, fatigue and palpitations Gastrointestinal: positive for abdominal pain and constipation Musculoskeletal:negative for myalgias Neurological: negative for gait problems and tremors Behavioral/Psych: negative for abusive relationship, depression Endocrine: negative for temperature intolerance    Genitourinary:negative for abnormal menstrual periods, genital lesions, hot flashes, sexual problems and vaginal discharge Integument/breast: negative for breast lump, breast tenderness, nipple discharge and skin lesion(s)    Objective:       BP 120/80   Pulse 77   Ht 5\' 5"  (1.651 m)   Wt 169 lb 3.2 oz (76.7 kg)   BMI 28.16 kg/m  General:   alert  Skin:   no rash or abnormalities  Lungs:   clear to auscultation bilaterally  Heart:   regular rate and rhythm, S1, S2 normal, no murmur, click, rub or gallop  Breasts:   normal without suspicious masses, skin or nipple changes or axillary nodes  Abdomen:  normal findings: no organomegaly, soft, non-tender and no hernia  Pelvis:  External genitalia: normal general appearance Urinary system: urethral meatus normal and bladder without fullness, nontender Vaginal: normal without tenderness,  induration or masses Cervix: normal appearance Adnexa: normal bimanual exam Uterus: anteverted and non-tender, normal size   Lab Review Urine pregnancy test Labs reviewed yes Radiologic studies reviewed yes  50% of 20 min visit spent on counseling and coordination of care.   Assessment:     1. Encounter for routine gynecological examination with Papanicolaou smear of cervix Rx: - Cytology - PAP  2. Vaginal discharge Rx: - Cervicovaginal ancillary only  3. Irritable bowel syndrome with constipation Rx: - dicyclomine (BENTYL) 20 MG tablet; Take 1 tablet (20 mg total) by mouth 3 (three) times daily before meals.  Dispense: 90 tablet; Refill: 5 - Ambulatory referral to Gastroenterology    Plan:    Education reviewed: calcium supplements, depression evaluation, low fat, low cholesterol diet, safe sex/STD  prevention, self breast exams and weight bearing exercise. Follow up in: 1 year.   Meds ordered this encounter  Medications  . dicyclomine (BENTYL) 20 MG tablet    Sig: Take 1 tablet (20 mg total) by mouth 3 (three) times daily before meals.    Dispense:  90 tablet    Refill:  5   Orders Placed This Encounter  Procedures  . Ambulatory referral to Gastroenterology    Referral Priority:   Routine    Referral Type:   Consultation    Referral Reason:   Specialty Services Required    Number of Visits Requested:   1    Brock Bad MD 02-10-2018

## 2018-02-11 LAB — CERVICOVAGINAL ANCILLARY ONLY
Bacterial vaginitis: NEGATIVE
Candida vaginitis: NEGATIVE

## 2018-02-12 LAB — CYTOLOGY - PAP
Diagnosis: NEGATIVE
HPV: NOT DETECTED

## 2018-03-08 ENCOUNTER — Encounter (HOSPITAL_BASED_OUTPATIENT_CLINIC_OR_DEPARTMENT_OTHER): Payer: Self-pay | Admitting: *Deleted

## 2018-03-08 ENCOUNTER — Emergency Department (HOSPITAL_BASED_OUTPATIENT_CLINIC_OR_DEPARTMENT_OTHER)
Admission: EM | Admit: 2018-03-08 | Discharge: 2018-03-08 | Disposition: A | Discharge status: Home / Self Care | Payer: BC Managed Care – PPO | Attending: Emergency Medicine | Admitting: Emergency Medicine

## 2018-03-08 ENCOUNTER — Emergency Department (HOSPITAL_BASED_OUTPATIENT_CLINIC_OR_DEPARTMENT_OTHER): Payer: BC Managed Care – PPO

## 2018-03-08 ENCOUNTER — Other Ambulatory Visit: Payer: Self-pay

## 2018-03-08 DIAGNOSIS — Z79899 Other long term (current) drug therapy: Secondary | ICD-10-CM | POA: Insufficient documentation

## 2018-03-08 DIAGNOSIS — Y9389 Activity, other specified: Secondary | ICD-10-CM | POA: Insufficient documentation

## 2018-03-08 DIAGNOSIS — S60221A Contusion of right hand, initial encounter: Secondary | ICD-10-CM | POA: Insufficient documentation

## 2018-03-08 DIAGNOSIS — W228XXA Striking against or struck by other objects, initial encounter: Secondary | ICD-10-CM | POA: Diagnosis not present

## 2018-03-08 DIAGNOSIS — Y998 Other external cause status: Secondary | ICD-10-CM | POA: Insufficient documentation

## 2018-03-08 DIAGNOSIS — S6991XA Unspecified injury of right wrist, hand and finger(s), initial encounter: Secondary | ICD-10-CM | POA: Diagnosis present

## 2018-03-08 DIAGNOSIS — Y92009 Unspecified place in unspecified non-institutional (private) residence as the place of occurrence of the external cause: Secondary | ICD-10-CM | POA: Insufficient documentation

## 2018-03-08 NOTE — ED Triage Notes (Signed)
She hit her right hand on the steps tonight.

## 2018-03-08 NOTE — ED Provider Notes (Signed)
[facility] EMERGENCY DEPARTMENT Provider Note   CSN: [id number] Arrival date & time: 03/08/18  2046     History   Chief Complaint Chief Complaint  Patient presents with  . Hand Injury    HPI [name] is a 47 y.o. female.  The history is provided by the patient.  Hand Injury   The incident occurred 1 to 2 hours ago. The incident occurred at home. The injury mechanism was a direct blow. The pain is present in the right hand. The quality of the pain is described as aching. The pain is at a severity of 2/10. The pain is mild. The pain has been fluctuating since the incident. The symptoms are aggravated by movement and palpation. She has tried nothing for the symptoms. The treatment provided no relief.    Past Medical History:  Diagnosis Date  . GERD (gastroesophageal reflux disease)   . Headache(784.0)   . Seasonal allergies     There are no active problems to display for this patient.   Past Surgical History:  Procedure Laterality Date  . CESAREAN SECTION     X4  . kidney stones    . TONSILLECTOMY  08/04/2011   Procedure: TONSILLECTOMY;  Surgeon: Serena Colonel, MD;  Location: [facility];  Service: ENT;  Laterality: N/A;  . TONSILLECTOMY    . TUBAL LIGATION       OB History    Gravida  5   Para  3   Term  2   Preterm  1   AB  1   Living  5     SAB      TAB  1   Ectopic  0   Multiple  1   Live Births  5            Home Medications    Prior to Admission medications   Medication Sig Start Date End Date Taking? Authorizing Provider  amitriptyline (ELAVIL) 50 MG tablet Take 50 mg by mouth at bedtime.    [provider]  cetirizine (ZYRTEC) 10 MG tablet Take 10 mg by mouth daily.    [provider]  diclofenac (VOLTAREN) 0.1 % ophthalmic solution Place 1 drop into the left eye 4 (four) times daily. Patient not taking: Reported on 01/29/2017 05/15/14   Rodolph Bong, MD  diclofenac (VOLTAREN) 75 MG EC  tablet Take 1 tablet (75 mg total) by mouth 2 (two) times daily. Patient not taking: Reported on 01/29/2017 12/13/14   Ozella Rocks, MD  dicyclomine (BENTYL) 20 MG tablet Take 1 tablet (20 mg total) by mouth 3 (three) times daily before meals. 02/10/18   Brock Bad, MD  fluticasone (FLONASE) 50 MCG/ACT nasal spray Place 2 sprays into both nostrils at bedtime. Patient not taking: Reported on 01/14/2016 01/31/15   Ozella Rocks, MD  HYDROcodone-acetaminophen (NORCO/VICODIN) 5-325 MG tablet Take 1-2 tablets by mouth every 6 hours as needed for pain and/or cough. Patient not taking: Reported on 02/10/2018 02/07/17   Pisciotta, Joni Reining, PA-C  ibuprofen (ADVIL,MOTRIN) 600 MG tablet Take 1 tablet (600 mg total) by mouth every 6 (six) hours as needed for pain. Patient not taking: Reported on 01/14/2016 05/24/12   Fayrene Helper, PA-C  ipratropium (ATROVENT) 0.06 % nasal spray Place 2 sprays into both nostrils 4 (four) times daily. Patient not taking: Reported on 01/14/2016 01/31/15   Ozella Rocks, MD  Multiple Vitamin (MULTIVITAMIN WITH MINERALS) TABS Take 1 tablet by mouth daily.  [provider]  predniSONE (DELTASONE) 10 MG tablet Take 2 tablets (20 mg total) by mouth 2 (two) times daily. Patient not taking: Reported on 01/14/2016 11/05/15   Geoffery Lyons, MD  promethazine (PHENERGAN) 25 MG suppository Place 1 suppository (25 mg total) rectally every 6 (six) hours as needed for nausea. 08/04/11 08/11/11  Serena Colonel, MD  ranitidine (ZANTAC) 150 MG tablet Take 150 mg by mouth 2 (two) times daily.    [provider]  zolpidem (AMBIEN) 10 MG tablet Take 10 mg by mouth at bedtime.     [provider]    Family History Family History  Problem Relation Age of Onset  . Diabetes Other   . Hypertension Other   . Cancer Mother   . Hypertension Father   . Diabetes Father   . Heart failure Father     Social History Social History   Tobacco Use  . Smoking status: Never  Smoker  . Smokeless tobacco: Never Used  Substance Use Topics  . Alcohol use: No  . Drug use: No     Allergies   Vicodin [hydrocodone-acetaminophen] and Penicillins   Review of Systems Review of Systems  HENT: Negative for ear pain.   Eyes: Negative for pain.  Cardiovascular: Negative for chest pain.  Gastrointestinal: Negative for diarrhea and nausea.  Musculoskeletal: Positive for arthralgias. Negative for back pain and gait problem.  Skin: Negative for rash.     Physical Exam Updated Vital Signs  ED Triage Vitals  Enc Vitals Group     BP 03/08/18 2102 (!) 142/103     Pulse Rate 03/08/18 2102 77     Resp 03/08/18 2102 18     Temp 03/08/18 2102 98.5 F (36.9 C)     Temp Source 03/08/18 2102 Oral     SpO2 03/08/18 2102 98 %     Weight 03/08/18 2103 165 lb (74.8 kg)     Height 03/08/18 2103 5\' 5"  (1.651 m)     Head Circumference --      Peak Flow --      Pain Score 03/08/18 2107 7     Pain Loc --      Pain Edu? --      Excl. in GC? --     Physical Exam  Constitutional: She appears well-developed and well-nourished. No distress.  HENT:  Head: Normocephalic and atraumatic.  Eyes: Conjunctivae are normal.  Neck: Neck supple.  Cardiovascular:  No murmur heard. Musculoskeletal: She exhibits tenderness. She exhibits no edema or deformity (TTP to right hand).  Neurological: She is alert.  Normal ROM of right hand and normal sensation   Skin: Skin is warm and dry.  Psychiatric: She has a normal mood and affect.  Nursing note and vitals reviewed.    ED Treatments / Results  Labs (all labs ordered are listed, but only abnormal results are displayed) Labs Reviewed - No data to display  EKG None  Radiology Dg Hand Complete Right  Result Date: 03/08/2018 CLINICAL DATA:  Blunt trauma EXAM: RIGHT HAND - COMPLETE 3+ VIEW COMPARISON:  None. FINDINGS: There is no evidence of fracture or dislocation. There is no evidence of arthropathy or other focal bone  abnormality. Soft tissues are unremarkable. IMPRESSION: Negative. Electronically Signed   By: Jasmine Pang M.D.   On: 03/08/2018 21:30    Procedures Procedures (including critical care time)  Medications Ordered in ED Medications - No data to display   Initial Impression / Assessment and Plan /  ED Course  I have reviewed the triage vital signs and the nursing notes.  Pertinent labs & imaging results that were available during my care of the patient were reviewed by me and considered in my medical decision making (see chart for details).     [name] is a 47 year old female who presents to the ED with right hand pain.  Patient with normal vitals.  No fever.  Patient struck the back of her right hand on the rail just prior to arrival.  Patient with no obvious deformity, swelling.  Neurovascularly intact on exam.  Normal strength and sensation in the right hand.  X-ray showed no fracture or dislocation. No snuff box tenderness.  Recommend Tylenol, Motrin, ice for pain.  Recommend follow-up with primary care doctor as needed and discharged in ED good condition.  This chart was dictated using voice recognition software.  Despite best efforts to proofread,  errors can occur which can change the documentation meaning.    Final Clinical Impressions(s) / ED Diagnoses   Final diagnoses:  Contusion of right hand, initial encounter    ED Discharge Orders    None       Virgina Norfolk, DO 03/08/18 2350

## 2018-03-10 ENCOUNTER — Ambulatory Visit (INDEPENDENT_AMBULATORY_CARE_PROVIDER_SITE_OTHER): Payer: BC Managed Care – PPO | Admitting: Obstetrics

## 2018-03-10 ENCOUNTER — Encounter: Payer: Self-pay | Admitting: Obstetrics

## 2018-03-10 VITALS — BP 145/89 | HR 78 | Ht 65.0 in | Wt 171.4 lb

## 2018-03-10 DIAGNOSIS — Z719 Counseling, unspecified: Secondary | ICD-10-CM

## 2018-03-10 NOTE — Progress Notes (Signed)
   GYNECOLOGY OFFICE VISIT NOTE  History:  47 y.o. [id number] here today for consult regarding endometrial cells on pap smear. She denies any abnormal vaginal discharge, bleeding, pelvic pain or other concerns.  She had just finished her period at the time of the pap smear.  She has no history of AUB.  Past Medical History:  Diagnosis Date  . GERD (gastroesophageal reflux disease)   . Headache(784.0)   . Seasonal allergies     Past Surgical History:  Procedure Laterality Date  . CESAREAN SECTION     X4  . kidney stones    . TONSILLECTOMY  08/04/2011   Procedure: TONSILLECTOMY;  Surgeon: Serena Colonel, MD;  Location: [facility];  Service: ENT;  Laterality: N/A;  . TONSILLECTOMY    . TUBAL LIGATION      The following portions of the patient's history were reviewed and updated as appropriate: allergies, current medications, past family history, past medical history, past social history, past surgical history and problem list.   Health Maintenance:  Normal pap and negative HRHPV on 02-10-2018.  Normal mammogram on 07-13-2017.   Review of Systems:  Pertinent items noted in HPI and remainder of comprehensive ROS otherwise negative.  Objective:   Physical Exam:  Deferred  BP (!) 145/89   Pulse 78   Ht 5\' 5"  (1.651 m)   Wt 171 lb 6.4 oz (77.7 kg)   LMP 01/27/2018   BMI 28.52 kg/m      Assessment & Plan:   1. Encounter for consultation - endometrial cells on pap smear that was done at end of period in a perimenopausal female with no history of AUB.                            - No need for endometrial biopsy.  Routine preventative health maintenance measures emphasized. Please refer to After Visit Summary for other counseling recommendations.   Return in about 1 year (around 03/11/2019) for annual exam.   Total face-to-face time with patient: 10 minutes.  Over 50% of encounter was spent on counseling and coordination of care.   Brock Bad, MD,  FACOG Obstetrician & Gynecologist, [facility] [place], [facility]

## 2018-03-10 NOTE — Progress Notes (Signed)
Pt presents for endometrial biopsy.  Procedure cancelled after consultation with provider.

## 2018-03-17 ENCOUNTER — Encounter (HOSPITAL_COMMUNITY): Payer: Self-pay

## 2018-03-17 ENCOUNTER — Emergency Department (HOSPITAL_COMMUNITY): Payer: BC Managed Care – PPO

## 2018-03-17 ENCOUNTER — Other Ambulatory Visit: Payer: Self-pay

## 2018-03-17 ENCOUNTER — Emergency Department (HOSPITAL_COMMUNITY)
Admission: EM | Admit: 2018-03-17 | Discharge: 2018-03-17 | Disposition: A | Discharge status: Home / Self Care | Payer: BC Managed Care – PPO | Attending: Emergency Medicine | Admitting: Emergency Medicine

## 2018-03-17 DIAGNOSIS — M25561 Pain in right knee: Secondary | ICD-10-CM | POA: Diagnosis not present

## 2018-03-17 DIAGNOSIS — Y92410 Unspecified street and highway as the place of occurrence of the external cause: Secondary | ICD-10-CM | POA: Insufficient documentation

## 2018-03-17 DIAGNOSIS — M25552 Pain in left hip: Secondary | ICD-10-CM | POA: Insufficient documentation

## 2018-03-17 DIAGNOSIS — M7918 Myalgia, other site: Secondary | ICD-10-CM

## 2018-03-17 DIAGNOSIS — M545 Low back pain: Secondary | ICD-10-CM | POA: Diagnosis present

## 2018-03-17 DIAGNOSIS — Y999 Unspecified external cause status: Secondary | ICD-10-CM | POA: Insufficient documentation

## 2018-03-17 DIAGNOSIS — Y9389 Activity, other specified: Secondary | ICD-10-CM | POA: Diagnosis not present

## 2018-03-17 DIAGNOSIS — Z79899 Other long term (current) drug therapy: Secondary | ICD-10-CM | POA: Diagnosis not present

## 2018-03-17 DIAGNOSIS — R52 Pain, unspecified: Secondary | ICD-10-CM

## 2018-03-17 MED ORDER — CLONAZEPAM 0.5 MG PO TABS
0.5000 mg | ORAL_TABLET | Freq: Once | ORAL | Status: AC
  Administered 2018-03-17: 0.5 mg via ORAL
  Filled 2018-03-17: qty 1

## 2018-03-17 MED ORDER — IBUPROFEN 800 MG PO TABS
800.0000 mg | ORAL_TABLET | Freq: Once | ORAL | Status: AC
  Administered 2018-03-17: 800 mg via ORAL
  Filled 2018-03-17: qty 1
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
## 2018-03-17 NOTE — Discharge Instructions (Addendum)
Please read attached information. If you experience any new or worsening signs or symptoms please return to the emergency room for evaluation. Please follow-up with your primary care provider or specialist as discussed.Please use ibuprofen or tylenol as needed for discomfort  °
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
## 2018-03-17 NOTE — ED Notes (Signed)
Patient transported to X-ray 

## 2018-03-19 ENCOUNTER — Emergency Department (HOSPITAL_COMMUNITY)
Admission: EM | Admit: 2018-03-19 | Discharge: 2018-03-19 | Disposition: A | Discharge status: Home / Self Care | Payer: BC Managed Care – PPO | Attending: Emergency Medicine | Admitting: Emergency Medicine

## 2018-03-19 ENCOUNTER — Emergency Department (HOSPITAL_COMMUNITY): Payer: BC Managed Care – PPO

## 2018-03-19 ENCOUNTER — Other Ambulatory Visit: Payer: Self-pay

## 2018-03-19 ENCOUNTER — Encounter (HOSPITAL_COMMUNITY): Payer: Self-pay | Admitting: Emergency Medicine

## 2018-03-19 DIAGNOSIS — S161XXA Strain of muscle, fascia and tendon at neck level, initial encounter: Secondary | ICD-10-CM | POA: Insufficient documentation

## 2018-03-19 DIAGNOSIS — Y92410 Unspecified street and highway as the place of occurrence of the external cause: Secondary | ICD-10-CM | POA: Diagnosis not present

## 2018-03-19 DIAGNOSIS — S199XXA Unspecified injury of neck, initial encounter: Secondary | ICD-10-CM | POA: Diagnosis present

## 2018-03-19 DIAGNOSIS — Z79899 Other long term (current) drug therapy: Secondary | ICD-10-CM | POA: Diagnosis not present

## 2018-03-19 DIAGNOSIS — Y939 Activity, unspecified: Secondary | ICD-10-CM | POA: Diagnosis not present

## 2018-03-19 DIAGNOSIS — Y999 Unspecified external cause status: Secondary | ICD-10-CM | POA: Diagnosis not present

## 2018-03-19 MED ORDER — IBUPROFEN 200 MG PO TABS
600.0000 mg | ORAL_TABLET | Freq: Once | ORAL | Status: AC
  Administered 2018-03-19: 600 mg via ORAL
  Filled 2018-03-19: qty 3

## 2018-03-19 MED ORDER — CYCLOBENZAPRINE HCL 10 MG PO TABS
5.0000 mg | ORAL_TABLET | Freq: Once | ORAL | Status: AC
  Administered 2018-03-19: 5 mg via ORAL
  Filled 2018-03-19: qty 1

## 2018-03-19 MED ORDER — CYCLOBENZAPRINE HCL 10 MG PO TABS: 10.0000 mg | ORAL_TABLET | Freq: Three times a day (TID) | ORAL | 0 refills | Status: DC | PRN

## 2018-03-19 MED ORDER — IBUPROFEN 600 MG PO TABS: 600.0000 mg | ORAL_TABLET | Freq: Three times a day (TID) | ORAL | 0 refills | Status: DC | PRN
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
## 2018-03-19 NOTE — ED Notes (Signed)
Patient transported to X-ray 
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
## 2018-03-23 ENCOUNTER — Ambulatory Visit: Payer: BC Managed Care – PPO | Admitting: Podiatry

## 2018-03-30 ENCOUNTER — Ambulatory Visit: Payer: BC Managed Care – PPO | Admitting: Podiatry

## 2018-06-03 ENCOUNTER — Other Ambulatory Visit: Payer: Self-pay | Admitting: Internal Medicine

## 2018-06-03 DIAGNOSIS — Z1231 Encounter for screening mammogram for malignant neoplasm of breast: Secondary | ICD-10-CM

## 2018-06-17 ENCOUNTER — Ambulatory Visit: Payer: BC Managed Care – PPO | Admitting: Podiatry

## 2018-06-21 ENCOUNTER — Ambulatory Visit: Payer: BC Managed Care – PPO | Admitting: Podiatry

## 2018-06-29 ENCOUNTER — Ambulatory Visit: Payer: BC Managed Care – PPO | Admitting: Podiatry

## 2018-06-29 ENCOUNTER — Other Ambulatory Visit: Payer: Self-pay | Admitting: Podiatry

## 2018-06-29 ENCOUNTER — Ambulatory Visit (INDEPENDENT_AMBULATORY_CARE_PROVIDER_SITE_OTHER): Payer: BC Managed Care – PPO

## 2018-06-29 VITALS — BP 116/76 | HR 85

## 2018-06-29 DIAGNOSIS — M79671 Pain in right foot: Secondary | ICD-10-CM

## 2018-06-29 DIAGNOSIS — M7751 Other enthesopathy of right foot: Secondary | ICD-10-CM | POA: Diagnosis not present

## 2018-06-29 DIAGNOSIS — M21619 Bunion of unspecified foot: Secondary | ICD-10-CM

## 2018-06-29 DIAGNOSIS — M21611 Bunion of right foot: Secondary | ICD-10-CM

## 2018-06-29 MED ORDER — DICLOFENAC SODIUM 1 % TD GEL: 2.0000 g | Freq: Four times a day (QID) | TRANSDERMAL | 2 refills | Status: DC

## 2018-06-29 NOTE — Progress Notes (Signed)
Subjective:   Patient ID: [name], female   DOB: 48 y.o.   MRN: [id number]   HPI [name] presents to the office today with concerns of bunions to both of her feet with the right side worse than left.  This been ongoing for several months and she is tried various offloading pads as well as shoe modifications with a significant improvement.  She presents today for evaluation of this and discuss treatment options.  She states weekly worse over the last 2 months   Review of Systems  All other systems reviewed and are negative.  Past Medical History:  Diagnosis Date  . GERD (gastroesophageal reflux disease)   . Headache(784.0)   . Seasonal allergies     Past Surgical History:  Procedure Laterality Date  . CESAREAN SECTION     X4  . kidney stones    . TONSILLECTOMY  08/04/2011   Procedure: TONSILLECTOMY;  Surgeon: Serena Colonel, MD;  Location: [facility];  Service: ENT;  Laterality: N/A;  . TONSILLECTOMY    . TUBAL LIGATION       Current Outpatient Medications:  .  albuterol (PROVENTIL HFA;VENTOLIN HFA) 108 (90 Base) MCG/ACT inhaler, , Disp: , Rfl:  .  albuterol (VENTOLIN HFA) 108 (90 Base) MCG/ACT inhaler, 1-2 inhalations every 4-6 hours as needed for wheezing. Dispense spacer as needed., Disp: , Rfl:  .  amitriptyline (ELAVIL) 50 MG tablet, Take 50 mg by mouth at bedtime., Disp: , Rfl:  .  azithromycin (ZITHROMAX) 250 MG tablet, , Disp: , Rfl:  .  benzonatate (TESSALON) 100 MG capsule, , Disp: , Rfl:  .  brompheniramine-pseudoephedrine-DM 30-2-10 MG/5ML syrup, , Disp: , Rfl:  .  cetirizine (ZYRTEC) 10 MG tablet, Take 10 mg by mouth at bedtime. , Disp: , Rfl:  .  clonazePAM (KLONOPIN) 0.5 MG tablet, Take 0.5 mg by mouth daily as needed for anxiety. , Disp: , Rfl: 2 .  cyclobenzaprine (FLEXERIL) 10 MG tablet, Take 1 tablet (10 mg total) by mouth 3 (three) times daily as needed for muscle spasms., Disp: 12 tablet, Rfl: 0 .  dicyclomine (BENTYL) 20 MG tablet, Take  1 tablet (20 mg total) by mouth 3 (three) times daily before meals. (Patient taking differently: Take 20 mg by mouth 2 (two) times daily. ), Disp: 90 tablet, Rfl: 5 .  fluticasone (FLONASE) 50 MCG/ACT nasal spray, Place 2 sprays into both nostrils at bedtime. (Patient taking differently: Place 2 sprays into both nostrils at bedtime as needed for allergies. ), Disp: 16 g, Rfl: 0 .  ibuprofen (ADVIL,MOTRIN) 600 MG tablet, Take 1 tablet (600 mg total) by mouth every 8 (eight) hours as needed., Disp: 15 tablet, Rfl: 0 .  ipratropium (ATROVENT) 0.06 % nasal spray, Place 2 sprays into both nostrils 4 (four) times daily., Disp: 15 mL, Rfl: 12 .  LINZESS 145 MCG CAPS capsule, Take 145 mcg by mouth every morning., Disp: , Rfl:  .  Multiple Vitamin (MULTIVITAMIN WITH MINERALS) TABS, Take 1 tablet by mouth at bedtime. , Disp: , Rfl:  .  predniSONE (DELTASONE) 10 MG tablet, 40 mg by mouth daily for 5 days. Take with food, Disp: , Rfl:  .  ranitidine (ZANTAC) 150 MG tablet, Take 150 mg by mouth 2 (two) times daily., Disp: , Rfl:  .  zolpidem (AMBIEN) 10 MG tablet, Take 10 mg by mouth at bedtime. , Disp: , Rfl:  .  diclofenac sodium (VOLTAREN) 1 % GEL, Apply 2 g topically 4 (four)  times daily. Rub into affected area of foot 2 to 4 times daily, Disp: 100 g, Rfl: 2 .  promethazine (PHENERGAN) 25 MG suppository, Place 1 suppository (25 mg total) rectally every 6 (six) hours as needed for nausea. (Patient not taking: Reported on 03/17/2018), Disp: 12 each, Rfl: 0  Allergies  Allergen Reactions  . Vicodin [Hydrocodone-Acetaminophen] Itching  . Penicillins Rash    Has patient had a PCN reaction causing immediate rash, facial/tongue/throat swelling, SOB or lightheadedness with hypotension: Yes Has patient had a PCN reaction causing severe rash involving mucus membranes or skin necrosis: No Has patient had a PCN reaction that required hospitalization: No Has patient had a PCN reaction occurring within the last 10  years: No If all of the above answers are "NO", then may proceed with Cephalosporin use.          Objective:  Physical Exam  General: AAO x3, NAD  Dermatological: Skin is warm, dry and supple bilateral. Nails x 10 are well manicured; remaining integument appears unremarkable at this time. There are no open sores, no preulcerative lesions, no rash or signs of infection present.  Vascular: Dorsalis Pedis artery and Posterior Tibial artery pedal pulses are 2/4 bilateral with immedate capillary fill time. Pedal hair growth present. No varicosities and no lower extremity edema present bilateral. There is no pain with calf compression, swelling, warmth, erythema.   Neruologic: Grossly intact via light touch bilateral. Vibratory intact via tuning fork bilateral. Protective threshold with Semmes Wienstein monofilament intact to all pedal sites bilateral.  Musculoskeletal: Mild bunion deformities present bilateral with the right side worse than left.  She has tenderness palpation directly on the bunion site on the medial first metatarsal head.  There is no pain or crepitation with MPJ range of motion of there is no first ray hypermobility present.  There is no other areas of tenderness elicited at this time.  There is a decrease in medial arch upon weightbearing.  Muscular strength 5/5 in all groups tested bilateral.  Gait: Unassisted, Nonantalgic.       Assessment:   Bilateral bunion deformity right side worse than left    Plan:  -Treatment options discussed including all alternatives, risks, and complications -Etiology of symptoms were discussed -X-rays were obtained and reviewed with the patient.  Mild bunion deformities present.  No evidence of acute fracture. -We discussed various treatment options both conservative as well as surgical options.  Conservatively we discussed shoe modifications, orthotics and I also prescribed Voltaren gel.  She is only to get an over-the-counter insert.   She also hold off on a steroid injection. -She states ultimately she wants to have surgery to get rid of the pressure in the bump.  We discussed with her possibly getting the screw fixation.  We discussed the surgery as well as postoperative course.  We include alternatives, risks, complications.  She wants to consider surgery and she wants to be tentatively scheduled for April when she is on spring break.  We discussed the postoperative course.  I will see her back again in April.  If she is continuing to have symptoms we will consider surgery however if she is improving we will hold off.  Vivi Barrack DPM

## 2018-06-29 NOTE — Progress Notes (Signed)
DG  °

## 2018-06-29 NOTE — Patient Instructions (Signed)
Bunion  A bunion is a bump on the base of the big toe that forms when the bones of the big toe joint move out of position. Bunions may be small at first, but they often get larger over time. They can make walking painful. What are the causes? A bunion may be caused by:  Wearing narrow or pointed shoes that force the big toe to press against the other toes.  Abnormal foot development that causes the foot to roll inward (pronate).  Changes in the foot that are caused by certain diseases, such as rheumatoid arthritis or polio.  A foot injury. What increases the risk? The following factors may make you more likely to develop this condition:  Wearing shoes that squeeze the toes together.  Having certain diseases, such as: ? Rheumatoid arthritis. ? Polio. ? Cerebral palsy.  Having family members who have bunions.  Being born with a foot deformity, such as flat feet or low arches.  Doing activities that put a lot of pressure on the feet, such as ballet dancing. What are the signs or symptoms? The main symptom of a bunion is a noticeable bump on the big toe. Other symptoms may include:  Pain.  Swelling around the big toe.  Redness and inflammation.  Thick or hardened skin on the big toe or between the toes.  Stiffness or loss of motion in the big toe.  Trouble with walking. How is this diagnosed? A bunion may be diagnosed based on your symptoms, medical history, and activities. You may have tests, such as:  X-rays. These allow your health care provider to check the position of the bones in your foot and look for damage to your joint. They also help your health care provider determine the severity of your bunion and the best way to treat it.  Joint aspiration. In this test, a sample of fluid is removed from the toe joint. This test may be done if you are in a lot of pain. It helps rule out diseases that cause painful swelling of the joints, such as arthritis. How is this  treated? Treatment depends on the severity of your symptoms. The goal of treatment is to relieve symptoms and prevent the bunion from getting worse. Your health care provider may recommend:  Wearing shoes that have a wide toe box.  Using bunion pads to cushion the affected area.  Taping your toes together to keep them in a normal position.  Placing a device inside your shoe (orthotics) to help reduce pressure on your toe joint.  Taking medicine to ease pain, inflammation, and swelling.  Applying heat or ice to the affected area.  Doing stretching exercises.  Surgery to remove scar tissue and move the toes back into their normal position. This treatment is rare. Follow these instructions at home: Managing pain, stiffness, and swelling   If directed, put ice on the painful area: ? Put ice in a plastic bag. ? Place a towel between your skin and the bag. ? Leave the ice on for 20 minutes, 2-3 times a day. Activity   If directed, apply heat to the affected area before you exercise. Use the heat source that your health care provider recommends, such as a moist heat pack or a heating pad. ? Place a towel between your skin and the heat source. ? Leave the heat on for 20-30 minutes. ? Remove the heat if your skin turns bright red. This is especially important if you are unable to feel pain,   heat, or cold. You may have a greater risk of getting burned.  Do exercises as told by your health care provider. General instructions  Support your toe joint with proper footwear, shoe padding, or taping as told by your health care provider.  Take over-the-counter and prescription medicines only as told by your health care provider.  Keep all follow-up visits as told by your health care provider. This is important. Contact a health care provider if your symptoms:  Get worse.  Do not improve in 2 weeks. Get help right away if you have:  Severe pain and trouble with walking. Summary  A  bunion is a bump on the base of the big toe that forms when the bones of the big toe joint move out of position.  Bunions can make walking painful.  Treatment depends on the severity of your symptoms.  Support your toe joint with proper footwear, shoe padding, or taping as told by your health care provider. This information is not intended to replace advice given to you by your health care provider. Make sure you discuss any questions you have with your health care provider. Document Released: 04/21/2005 Document Revised: 09/01/2017 Document Reviewed: 09/01/2017 Elsevier Interactive Patient Education  2019 [employer]  

## 2018-06-30 ENCOUNTER — Telehealth: Payer: Self-pay | Admitting: Podiatry

## 2018-06-30 NOTE — Telephone Encounter (Signed)
Pt called and the gel that you have called in for pt yesterday is waiting on prior authorization. She asked if she should wait until it gets authorized or should she start something else.

## 2018-07-01 NOTE — Telephone Encounter (Signed)
Called and spoke with patient and stated that we were working on the authorization and that patient could try over the counter topicals such as asper cream with lidocaine and patient wanted to know could she use bio-freeze as well and I stated that I would ask Dr Ardelle Anton and call her back. Misty Stanley

## 2018-07-01 NOTE — Telephone Encounter (Signed)
[name]- can you please follow up on this.   Also, let her know that she can use other topicals over the counter including asper cream with lidocaine.

## 2018-07-16 ENCOUNTER — Ambulatory Visit
Admission: RE | Admit: 2018-07-16 | Discharge: 2018-07-16 | Disposition: A | Discharge status: Home / Self Care | Payer: BC Managed Care – PPO | Source: Ambulatory Visit | Attending: Internal Medicine | Admitting: Internal Medicine

## 2018-07-16 ENCOUNTER — Other Ambulatory Visit: Payer: Self-pay

## 2018-07-16 DIAGNOSIS — Z1231 Encounter for screening mammogram for malignant neoplasm of breast: Secondary | ICD-10-CM

## 2018-08-04 ENCOUNTER — Telehealth: Payer: Self-pay | Admitting: *Deleted

## 2018-08-04 NOTE — Telephone Encounter (Signed)
I left [name] a message to call me back.  I'm trying to reschedule her surgery from 08/11/2018 to 09/22/2018.

## 2018-08-04 NOTE — Telephone Encounter (Signed)
"  This is [name], I'm returning your call."  I need to reschedule your surgery again.  Can you do it on Sep 22, 2018?  "I need to do it while we are on break from school."  When do you all go on break?  "We start our break in June.  Can we do it on June 10?"  Yes, June 10 is available.  I'll reschedule it to then.  "How long will I be out?"  It takes about six to eight weeks for actual bone healing.  You still need to see Dr. Ardelle Anton for a consultation.  "Okay, thank you."  Be safe.

## 2018-08-10 ENCOUNTER — Ambulatory Visit: Payer: BC Managed Care – PPO | Admitting: Podiatry

## 2018-08-19 ENCOUNTER — Ambulatory Visit (INDEPENDENT_AMBULATORY_CARE_PROVIDER_SITE_OTHER): Payer: BC Managed Care – PPO | Admitting: Podiatry

## 2018-08-19 ENCOUNTER — Encounter: Payer: Self-pay | Admitting: Podiatry

## 2018-08-19 ENCOUNTER — Other Ambulatory Visit: Payer: Self-pay

## 2018-08-19 DIAGNOSIS — M21619 Bunion of unspecified foot: Secondary | ICD-10-CM | POA: Diagnosis not present

## 2018-08-19 NOTE — Progress Notes (Signed)
Subjective: Ms. Updegrove presents to the office today for surgical consultation of a painful bunion.  She has purchased inserts which is been helpful and she is been using Aspercreme which is been helpful but he is also not been walking recently.  She is concerned that when she wear shoes it still rubs and she wants to have surgery in order to remove the bump/bunion. Denies any systemic complaints such as fevers, chills, nausea, vomiting. No acute changes since last appointment, and no other complaints at this time.   Objective: AAO x3, NAD DP/PT pulses palpable bilaterally, CRT less than 3 seconds Mild bunion deformities present and there is mild tenderness palpation along the medial aspect of the first metatarsal head on right foot.  Is no pain with rotation at the beginning to motion.  Is no prescribed regularly present.  There is no edema.  No other areas of tenderness identified.  No open lesions or pre-ulcerative lesions.  No pain with calf compression, swelling, warmth, erythema  Assessment: Right foot HAV  Plan: -All treatment options discussed with the patient including all alternatives, risks, complications.  -I reviewed with her her x-rays.  Discussed with conservative as well as surgical options.  After discussion she wants to proceed with surgery. -Discussed right foot [place] bunionectomy with screw fixation. -The incision placement as well as the postoperative course was discussed with the patient. I discussed risks of the surgery which include, but not limited to, infection, bleeding, pain, swelling, need for further surgery, delayed or nonhealing, painful or ugly scar, numbness or sensation changes, over/under correction, recurrence, transfer lesions, further deformity, hardware failure, DVT/PE, loss of toe/foot. Patient understands these risks and wishes to proceed with surgery. The surgical consent was reviewed with the patient all 3 pages were signed. No promises or guarantees were  given to the outcome of the procedure. All questions were answered to the best of my ability. Before the surgery the patient was encouraged to call the office if there is any further questions. The surgery will be performed at the [facility] on an outpatient basis. -Patient encouraged to call the office with any questions, concerns, change in symptoms.   Vivi Barrack DPM

## 2018-08-19 NOTE — Patient Instructions (Signed)

## 2018-08-23 ENCOUNTER — Telehealth: Payer: Self-pay | Admitting: *Deleted

## 2018-08-23 NOTE — Telephone Encounter (Signed)
I attempted to call the patient to see if she would like to move her surgery up to either Wednesday, April 22 or 29.  I left her a message to call me back.

## 2018-08-24 NOTE — Telephone Encounter (Signed)
"  I received your message.  I would love to move up but first, I need to know how much I would have to pay out of pocket.  I think the nurse said I would have two separate payments due."  You will actually will have three separate statements, a professional fee, a facility fee, and an anesthesia fee.   I will ask Chip Boer in our insurance department to give you a call and give you an estimate.  "Perfect, that would be great."  You need to call the surgical center to get their fee and ask about the anesthesia fee.  Your procedure will take 1 hour.  "Okay, I will."  [name] Bunionectomy right foot - 1 hour)

## 2018-09-24 ENCOUNTER — Telehealth: Payer: Self-pay | Admitting: *Deleted

## 2018-09-24 NOTE — Telephone Encounter (Signed)
I am calling you in regards to your surgery that's scheduled for October 13, 2018.  "I was going to call you all regarding that.  I was going to make some calls to see how much I'll need to take care of my portion today.  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  "That will be fine.  That will give me time to work all this out."  Thank you, I'll get your surgery rescheduled from October 13, 2018 to October 20, 2018.  I rescheduled her surgery from 10/13/2018 to 10/20/2018 via the surgical center's [facility] [employer] Portal.

## 2018-10-07 ENCOUNTER — Telehealth: Payer: Self-pay | Admitting: *Deleted

## 2018-10-07 NOTE — Telephone Encounter (Signed)
"  I'm scheduled to have surgery on June 17.  I spoke to the lady you told me to call.  She said I have a $1260 deductible.  I don't have that, so, I'm going to have to cancel my surgery."  You do not have to pay that amount up front.  "So I can make payment arrangements?"  Yes, you can make payment arrangements.  You may want to call the surgery center to see how much they will require you to pay the day of your surgery.  "Okay, I'll give them a call right now."

## 2018-10-11 ENCOUNTER — Telehealth: Payer: Self-pay | Admitting: *Deleted

## 2018-10-11 NOTE — Telephone Encounter (Signed)
Keyry Niu Date of Birth: 1970/06/15, Age: 48 [facility] [address]) Medical Record # [id number] Procedure: [place] WITH SCREW FIXATION RIGHT FOOT on 10/20/2018 Physician: Vista Deck, [place], [address] Home: [phone]   Checklist - Journal    Print Notes    Date  Time  Staff  Action   10/11/2018  11:52:12  Rhetta Mura  Progress Note, cancelled, emailed cs  Cancelled d/t insurance deductible & other commitments. Referred patient to Dr. Leigh Aurora office. Thanks, Rhetta Mura RN [facility] Pre Anesthesia Assessment Nurse

## 2018-10-13 NOTE — Telephone Encounter (Signed)
"  I'm scheduled for surgery on the seventeenth.  I'm going to have to reschedule that."  Would you like to reschedule it now?  "I'm going to have to call back to reschedule it."  I will cancel it.  Please give [place] a call when you're ready to reschedule.  "I will."

## 2019-03-01 ENCOUNTER — Other Ambulatory Visit: Payer: Self-pay

## 2019-03-01 DIAGNOSIS — Z20822 Contact with and (suspected) exposure to covid-19: Secondary | ICD-10-CM

## 2019-03-03 LAB — NOVEL CORONAVIRUS, NAA: SARS-CoV-2, NAA: NOT DETECTED

## 2019-03-09 ENCOUNTER — Encounter: Payer: Self-pay | Admitting: Obstetrics

## 2019-03-09 ENCOUNTER — Other Ambulatory Visit: Payer: Self-pay

## 2019-03-09 ENCOUNTER — Ambulatory Visit (INDEPENDENT_AMBULATORY_CARE_PROVIDER_SITE_OTHER): Payer: BC Managed Care – PPO | Admitting: Obstetrics

## 2019-03-09 VITALS — BP 125/83 | HR 96 | Ht 65.0 in | Wt 170.5 lb

## 2019-03-09 DIAGNOSIS — N898 Other specified noninflammatory disorders of vagina: Secondary | ICD-10-CM

## 2019-03-09 DIAGNOSIS — K581 Irritable bowel syndrome with constipation: Secondary | ICD-10-CM

## 2019-03-09 DIAGNOSIS — Z124 Encounter for screening for malignant neoplasm of cervix: Secondary | ICD-10-CM

## 2019-03-09 DIAGNOSIS — Z01419 Encounter for gynecological examination (general) (routine) without abnormal findings: Secondary | ICD-10-CM

## 2019-03-09 DIAGNOSIS — Z1151 Encounter for screening for human papillomavirus (HPV): Secondary | ICD-10-CM

## 2019-03-09 NOTE — Progress Notes (Signed)
Subjective:        [name] is a 48 y.o. female here for a routine exam.  Current complaints: None.    Personal health questionnaire:  Is patient [name], have a family history of breast and/or ovarian cancer: no Is there a family history of uterine cancer diagnosed at age < 70, gastrointestinal cancer, urinary tract cancer, family member who is a Personnel officer syndrome-associated carrier: yes Is the patient overweight and hypertensive, family history of diabetes, personal history of gestational diabetes, preeclampsia or PCOS: yes Is patient over 58, have PCOS,  family history of premature CHD under age 40, diabetes, smoke, have hypertension or peripheral artery disease:  yes At any time, has a partner hit, kicked or otherwise hurt or frightened you?: no Over the past 2 weeks, have you felt down, depressed or hopeless?: no Over the past 2 weeks, have you felt little interest or pleasure in doing things?:no   Gynecologic History No LMP recorded. Patient is perimenopausal. Contraception: tubal ligation Last Pap: 02-10-2018. Results were: normal Last mammogram: March 2020. Results were: normal  Obstetric History OB History  Gravida Para Term Preterm AB Living  5 3 2 1 1 5   SAB TAB Ectopic Multiple Live Births    1 0 1 5    # Outcome Date GA Lbr Len/2nd Weight Sex Delivery Anes PTL Lv  5 Term 10/07/04     CS-LTranv   LIV  4A Gravida 01/03/02     CS-LTranv   LIV  4B Preterm 01/13/02     CS-LTranv   LIV  3 Term 01/09/00     CS-LTranv   LIV  2 Gravida 06/08/91     CS-LTranv   LIV  1 TAB             Past Medical History:  Diagnosis Date  . GERD (gastroesophageal reflux disease)   . Headache(784.0)   . Seasonal allergies     Past Surgical History:  Procedure Laterality Date  . CESAREAN SECTION     X4  . kidney stones    . TONSILLECTOMY  08/04/2011   Procedure: TONSILLECTOMY;  Surgeon: 10/04/2011, MD;  Location: [facility];  Service: ENT;  Laterality:  N/A;  . TONSILLECTOMY    . TUBAL LIGATION       Current Outpatient Medications:  .  amitriptyline (ELAVIL) 50 MG tablet, Take 50 mg by mouth at bedtime., Disp: , Rfl:  .  cetirizine (ZYRTEC) 10 MG tablet, Take 10 mg by mouth at bedtime. , Disp: , Rfl:  .  clonazePAM (KLONOPIN) 0.5 MG tablet, Take 0.5 mg by mouth daily as needed for anxiety. , Disp: , Rfl: 2 .  dicyclomine (BENTYL) 20 MG tablet, Take 1 tablet (20 mg total) by mouth 3 (three) times daily before meals. (Patient taking differently: Take 20 mg by mouth 2 (two) times daily. ), Disp: 90 tablet, Rfl: 5 .  fluticasone (FLONASE) 50 MCG/ACT nasal spray, Place 2 sprays into both nostrils at bedtime. (Patient taking differently: Place 2 sprays into both nostrils at bedtime as needed for allergies. ), Disp: 16 g, Rfl: 0 .  omeprazole (PRILOSEC) 10 MG capsule, Take 10 mg by mouth daily., Disp: , Rfl:  .  zolpidem (AMBIEN) 10 MG tablet, Take 10 mg by mouth at bedtime. , Disp: , Rfl:  .  albuterol (PROVENTIL HFA;VENTOLIN HFA) 108 (90 Base) MCG/ACT inhaler, , Disp: , Rfl:  .  albuterol (VENTOLIN HFA) 108 (90 Base) MCG/ACT inhaler,  1-2 inhalations every 4-6 hours as needed for wheezing. Dispense spacer as needed., Disp: , Rfl:  .  azithromycin (ZITHROMAX) 250 MG tablet, , Disp: , Rfl:  .  benzonatate (TESSALON) 100 MG capsule, , Disp: , Rfl:  .  brompheniramine-pseudoephedrine-DM 30-2-10 MG/5ML syrup, , Disp: , Rfl:  .  cyclobenzaprine (FLEXERIL) 10 MG tablet, Take 1 tablet (10 mg total) by mouth 3 (three) times daily as needed for muscle spasms., Disp: 12 tablet, Rfl: 0 .  diclofenac sodium (VOLTAREN) 1 % GEL, Apply 2 g topically 4 (four) times daily. Rub into affected area of foot 2 to 4 times daily, Disp: 100 g, Rfl: 2 .  ibuprofen (ADVIL,MOTRIN) 600 MG tablet, Take 1 tablet (600 mg total) by mouth every 8 (eight) hours as needed. (Patient not taking: Reported on 03/09/2019), Disp: 15 tablet, Rfl: 0 .  ipratropium (ATROVENT) 0.06 % nasal spray,  Place 2 sprays into both nostrils 4 (four) times daily. (Patient not taking: Reported on 03/09/2019), Disp: 15 mL, Rfl: 12 .  LINZESS 145 MCG CAPS capsule, Take 145 mcg by mouth every morning., Disp: , Rfl:  .  Multiple Vitamin (MULTIVITAMIN WITH MINERALS) TABS, Take 1 tablet by mouth at bedtime. , Disp: , Rfl:  .  predniSONE (DELTASONE) 10 MG tablet, 40 mg by mouth daily for 5 days. Take with food, Disp: , Rfl:  .  promethazine (PHENERGAN) 25 MG suppository, Place 1 suppository (25 mg total) rectally every 6 (six) hours as needed for nausea. (Patient not taking: Reported on 03/17/2018), Disp: 12 each, Rfl: 0 .  ranitidine (ZANTAC) 150 MG tablet, Take 150 mg by mouth 2 (two) times daily., Disp: , Rfl:  Allergies  Allergen Reactions  . Vicodin [Hydrocodone-Acetaminophen] Itching  . Penicillins Rash    Has patient had a PCN reaction causing immediate rash, facial/tongue/throat swelling, SOB or lightheadedness with hypotension: Yes Has patient had a PCN reaction causing severe rash involving mucus membranes or skin necrosis: No Has patient had a PCN reaction that required hospitalization: No Has patient had a PCN reaction occurring within the last 10 years: No If all of the above answers are "NO", then may proceed with Cephalosporin use.     Social History   Tobacco Use  . Smoking status: Never Smoker  . Smokeless tobacco: Never Used  Substance Use Topics  . Alcohol use: No    Family History  Problem Relation Age of Onset  . Diabetes Other   . Hypertension Other   . Cancer Mother   . Hypertension Father   . Diabetes Father   . Heart failure Father       Review of Systems  Constitutional: negative for fatigue and weight loss Respiratory: negative for cough and wheezing Cardiovascular: negative for chest pain, fatigue and palpitations Gastrointestinal: negative for abdominal pain and change in bowel habits Musculoskeletal:negative for myalgias Neurological: negative for gait  problems and tremors Behavioral/Psych: negative for abusive relationship, depression Endocrine: negative for temperature intolerance    Genitourinary:negative for abnormal menstrual periods, genital lesions, hot flashes, sexual problems and vaginal discharge Integument/breast: negative for breast lump, breast tenderness, nipple discharge and skin lesion(s)    Objective:       BP 125/83   Pulse 96   Ht 5\' 5"  (1.651 m)   Wt 170 lb 8 oz (77.3 kg)   BMI 28.37 kg/m  General:   alert  Skin:   no rash or abnormalities  Lungs:   clear to auscultation bilaterally  Heart:  regular rate and rhythm, S1, S2 normal, no murmur, click, rub or gallop  Breasts:   normal without suspicious masses, skin or nipple changes or axillary nodes  Abdomen:  normal findings: no organomegaly, soft, non-tender and no hernia  Pelvis:  External genitalia: normal general appearance Urinary system: urethral meatus normal and bladder without fullness, nontender Vaginal: normal without tenderness, induration or masses Cervix: normal appearance Adnexa: normal bimanual exam Uterus: anteverted and non-tender, normal size   Lab Review Urine pregnancy test Labs reviewed yes Radiologic studies reviewed yes  50% of 25 min visit spent on counseling and coordination of care.   Assessment:     1. Encounter for routine gynecological examination with Papanicolaou smear of cervix Rx: - Cytology - PAP( [facility])  2. Vaginal discharge Rx: - Cervicovaginal ancillary only( [place])  3. Irritable bowel syndrome with constipation - GI referral scheduled with Dr. Collene Mares   Plan:    Education reviewed: calcium supplements, depression evaluation, low fat, low cholesterol diet, safe sex/STD prevention, self breast exams and weight bearing exercise. Follow up in: 2 years.   No orders of the defined types were placed in this encounter.  No orders of the defined types were placed in this encounter.   Shelly Bombard, MD 03/09/2019 9:52 AM

## 2019-03-09 NOTE — Progress Notes (Signed)
Patient is in the office for annual, last pap 02-10-18.  Last mammogram 07-16-18 Pt declines std testing.

## 2019-03-10 LAB — CERVICOVAGINAL ANCILLARY ONLY
Bacterial Vaginitis (gardnerella): NEGATIVE
Candida Glabrata: NEGATIVE
Candida Vaginitis: NEGATIVE
Comment: NEGATIVE
Comment: NEGATIVE
Comment: NEGATIVE

## 2019-03-11 LAB — CYTOLOGY - PAP
Comment: NEGATIVE
Diagnosis: NEGATIVE
High risk HPV: NEGATIVE

## 2019-03-21 ENCOUNTER — Other Ambulatory Visit: Payer: Self-pay

## 2019-03-21 DIAGNOSIS — Z20822 Contact with and (suspected) exposure to covid-19: Secondary | ICD-10-CM

## 2019-03-23 LAB — NOVEL CORONAVIRUS, NAA: SARS-CoV-2, NAA: NOT DETECTED

## 2019-04-20 ENCOUNTER — Encounter: Payer: Self-pay | Admitting: Obstetrics

## 2019-05-03 ENCOUNTER — Encounter: Payer: Self-pay | Admitting: Obstetrics

## 2019-06-14 ENCOUNTER — Other Ambulatory Visit: Payer: Self-pay | Admitting: Internal Medicine

## 2019-06-14 DIAGNOSIS — Z1231 Encounter for screening mammogram for malignant neoplasm of breast: Secondary | ICD-10-CM

## 2019-07-22 ENCOUNTER — Ambulatory Visit
Admission: RE | Admit: 2019-07-22 | Discharge: 2019-07-22 | Disposition: A | Discharge status: Home / Self Care | Payer: BC Managed Care – PPO | Source: Ambulatory Visit | Attending: Internal Medicine | Admitting: Internal Medicine

## 2019-07-22 ENCOUNTER — Ambulatory Visit: Payer: BC Managed Care – PPO

## 2019-07-22 ENCOUNTER — Other Ambulatory Visit: Payer: Self-pay

## 2019-07-22 DIAGNOSIS — Z1231 Encounter for screening mammogram for malignant neoplasm of breast: Secondary | ICD-10-CM

## 2019-07-25 ENCOUNTER — Ambulatory Visit: Payer: BC Managed Care – PPO

## 2019-08-31 ENCOUNTER — Ambulatory Visit
Admission: EM | Admit: 2019-08-31 | Discharge: 2019-08-31 | Disposition: A | Discharge status: Home / Self Care | Payer: BC Managed Care – PPO | Attending: Emergency Medicine | Admitting: Emergency Medicine

## 2019-08-31 ENCOUNTER — Encounter: Payer: Self-pay | Admitting: Emergency Medicine

## 2019-08-31 DIAGNOSIS — J069 Acute upper respiratory infection, unspecified: Secondary | ICD-10-CM | POA: Diagnosis not present

## 2019-08-31 MED ORDER — LORATADINE 10 MG PO TABS: 10.0000 mg | ORAL_TABLET | Freq: Every day | ORAL | 0 refills | Status: DC

## 2019-08-31 MED ORDER — BENZONATATE 100 MG PO CAPS: 100.0000 mg | ORAL_CAPSULE | Freq: Three times a day (TID) | ORAL | 0 refills | Status: DC

## 2019-08-31 NOTE — ED Provider Notes (Signed)
[facility]    CSN: [id number] Arrival date & time: 08/31/19  0930      History   Chief Complaint Chief Complaint  Patient presents with  . URI    HPI [name] is a 49 y.o. female with history of seasonal allergies, headaches presenting for URI symptoms x1 week.  Endorsing sinus congestion and pressure, mild, dry cough, malaise.  States she saw her PCP last week: Given Z-Pak which temporarily help with symptoms, though "came right back ".  Endorsing frontal headache.  Has used Flonase, Zyrtec, Benadryl with mild relief.  Patient received her second dose of the Pfizer Covid vaccine 4/19: Tolerated well.  Denies known sick contacts, though states her son had similar symptoms and tested positive for Covid last week.  Has close contact.  No fever, chest pain, shortness of breath.   Past Medical History:  Diagnosis Date  . GERD (gastroesophageal reflux disease)   . Headache(784.0)   . Seasonal allergies     Patient Active Problem List   Diagnosis Date Noted  . Bunion 06/29/2018    Past Surgical History:  Procedure Laterality Date  . CESAREAN SECTION     X4  . kidney stones    . TONSILLECTOMY  08/04/2011   Procedure: TONSILLECTOMY;  Surgeon: Serena Colonel, MD;  Location: [facility];  Service: ENT;  Laterality: N/A;  . TONSILLECTOMY    . TUBAL LIGATION      OB History    Gravida  5   Para  3   Term  2   Preterm  1   AB  1   Living  5     SAB      TAB  1   Ectopic  0   Multiple  1   Live Births  5            Home Medications    Prior to Admission medications   Medication Sig Start Date End Date Taking? Authorizing Provider  albuterol (PROVENTIL HFA;VENTOLIN HFA) 108 (90 Base) MCG/ACT inhaler  03/29/18   [provider]  albuterol (VENTOLIN HFA) 108 (90 Base) MCG/ACT inhaler 1-2 inhalations every 4-6 hours as needed for wheezing. Dispense spacer as needed. 03/29/18 03/29/19  [provider]    amitriptyline (ELAVIL) 50 MG tablet Take 50 mg by mouth at bedtime.    [provider]  azithromycin (ZITHROMAX) 250 MG tablet  03/30/18   [provider]  benzonatate (TESSALON) 100 MG capsule Take 1 capsule (100 mg total) by mouth every 8 (eight) hours. 08/31/19   Hall-Potvin, [place], PA-C  brompheniramine-pseudoephedrine-DM 30-2-10 MG/5ML syrup  03/26/18   [provider]  clonazePAM (KLONOPIN) 0.5 MG tablet Take 0.5 mg by mouth daily as needed for anxiety.  03/01/18   [provider]  cyclobenzaprine (FLEXERIL) 10 MG tablet Take 1 tablet (10 mg total) by mouth 3 (three) times daily as needed for muscle spasms. 03/19/18   Azalia Bilis, MD  diclofenac sodium (VOLTAREN) 1 % GEL Apply 2 g topically 4 (four) times daily. Rub into affected area of foot 2 to 4 times daily 06/29/18   Vivi Barrack, DPM  dicyclomine (BENTYL) 20 MG tablet Take 1 tablet (20 mg total) by mouth 3 (three) times daily before meals. Patient taking differently: Take 20 mg by mouth 2 (two) times daily.  02/10/18   Brock Bad, MD  fluticasone (FLONASE) 50 MCG/ACT nasal spray Place 2 sprays into both nostrils at bedtime. Patient  taking differently: Place 2 sprays into both nostrils at bedtime as needed for allergies.  01/31/15   Ozella Rocks, MD  ibuprofen (ADVIL,MOTRIN) 600 MG tablet Take 1 tablet (600 mg total) by mouth every 8 (eight) hours as needed. Patient not taking: Reported on 03/09/2019 03/19/18   Azalia Bilis, MD  ipratropium (ATROVENT) 0.06 % nasal spray Place 2 sprays into both nostrils 4 (four) times daily. Patient not taking: Reported on 03/09/2019 01/31/15   Ozella Rocks, MD  LINZESS 145 MCG CAPS capsule Take 145 mcg by mouth every morning. 06/09/18   [provider]  loratadine (CLARITIN) 10 MG tablet Take 1 tablet (10 mg total) by mouth daily. 08/31/19   Hall-Potvin, [place], PA-C  Multiple Vitamin (MULTIVITAMIN WITH MINERALS) TABS Take 1 tablet by  mouth at bedtime.     [provider]  omeprazole (PRILOSEC) 10 MG capsule Take 10 mg by mouth daily.    [provider]  predniSONE (DELTASONE) 10 MG tablet 40 mg by mouth daily for 5 days. Take with food 03/29/18   [provider]  promethazine (PHENERGAN) 25 MG suppository Place 1 suppository (25 mg total) rectally every 6 (six) hours as needed for nausea. Patient not taking: Reported on 03/17/2018 08/04/11 08/11/11  Serena Colonel, MD  ranitidine (ZANTAC) 150 MG tablet Take 150 mg by mouth 2 (two) times daily.    [provider]  zolpidem (AMBIEN) 10 MG tablet Take 10 mg by mouth at bedtime.     [provider]    Family History Family History  Problem Relation Age of Onset  . Diabetes Other   . Hypertension Other   . Cancer Mother   . Hypertension Father   . Diabetes Father   . Heart failure Father     Social History Social History   Tobacco Use  . Smoking status: Never Smoker  . Smokeless tobacco: Never Used  Substance Use Topics  . Alcohol use: No  . Drug use: No     Allergies   Vicodin [hydrocodone-acetaminophen] and Penicillins   Review of Systems As per HPI   Physical Exam Triage Vital Signs ED Triage Vitals  Enc Vitals Group     BP      Pulse      Resp      Temp      Temp src      SpO2      Weight      Height      Head Circumference      Peak Flow      Pain Score      Pain Loc      Pain Edu?      Excl. in GC?    No data found.  Updated Vital Signs BP 136/82 (BP Location: Left Arm)   Pulse (!) 106   Temp 98.4 F (36.9 C) (Oral)   Resp 18   SpO2 96%   Visual Acuity Right Eye Distance:   Left Eye Distance:   Bilateral Distance:    Right Eye Near:   Left Eye Near:    Bilateral Near:     Physical Exam Constitutional:      General: She is not in acute distress.    Appearance: She is obese. She is not ill-appearing or diaphoretic.  HENT:     Head: Normocephalic and atraumatic.     Right Ear:  Tympanic membrane, ear canal and external ear normal.     Left Ear: Tympanic membrane,  ear canal and external ear normal.     Nose:     Comments: Lateral turbinate edema with mucosal pallor (R>L).  Negative sinus tenderness bilaterally.    Mouth/Throat:     Mouth: Mucous membranes are moist.     Pharynx: Oropharynx is clear. No oropharyngeal exudate or posterior oropharyngeal erythema.  Eyes:     General: No scleral icterus.    Conjunctiva/sclera: Conjunctivae normal.     Pupils: Pupils are equal, round, and reactive to light.  Neck:     Comments: Trachea midline, negative JVD Cardiovascular:     Rate and Rhythm: Regular rhythm. Tachycardia present.     Heart sounds: No murmur. No gallop.   Pulmonary:     Effort: Pulmonary effort is normal. No respiratory distress.     Breath sounds: No wheezing, rhonchi or rales.  Musculoskeletal:     Cervical back: Neck supple. No tenderness.     Right lower leg: No edema.     Left lower leg: No edema.  Lymphadenopathy:     Cervical: No cervical adenopathy.  Skin:    Capillary Refill: Capillary refill takes less than 2 seconds.     Coloration: Skin is not jaundiced or pale.     Findings: No rash.  Neurological:     General: No focal deficit present.     Mental Status: She is alert and oriented to person, place, and time.      UC Treatments / Results  Labs (all labs ordered are listed, but only abnormal results are displayed) Labs Reviewed  NOVEL CORONAVIRUS, NAA    EKG   Radiology No results found.  Procedures Procedures (including critical care time)  Medications Ordered in UC Medications - No data to display  Initial Impression / Assessment and Plan / UC Course  I have reviewed the triage vital signs and the nursing notes.  Pertinent labs & imaging results that were available during my care of the patient were reviewed by me and considered in my medical decision making (see chart for details).     Patient afebrile,  nontoxic, with SpO2 96%.  Covid PCR pending.  Patient to quarantine until results are back.  We will treat supportively as outlined below.  Return precautions discussed, patient verbalized understanding and is agreeable to plan. Final Clinical Impressions(s) / UC Diagnoses   Final diagnoses:  Viral URI with cough     Discharge Instructions     Your COVID test is pending - it is important to quarantine / isolate at home until your results are back. If you test positive and would like further evaluation for persistent or worsening symptoms, you may schedule an E-visit or virtual (video) visit throughout the [facility] app or website.  PLEASE NOTE: If you develop severe chest pain or shortness of breath please go to the ER or call 9-1-1 for further evaluation --> DO NOT schedule electronic or virtual visits for this. Please call our office for further guidance / recommendations as needed.  For information about the Covid vaccine, please visit [email or web address]    ED Prescriptions    Medication Sig Dispense Auth. Provider   benzonatate (TESSALON) 100 MG capsule Take 1 capsule (100 mg total) by mouth every 8 (eight) hours. 21 capsule Hall-Potvin, [place], PA-C   loratadine (CLARITIN) 10 MG tablet Take 1 tablet (10 mg total) by mouth daily. 30 tablet Hall-Potvin, [place], PA-C     PDMP not reviewed this encounter.   Hall-Potvin, [place], [place] 08/31/19 1004

## 2019-08-31 NOTE — ED Triage Notes (Signed)
Seen by provider

## 2019-08-31 NOTE — Discharge Instructions (Addendum)
Your COVID test is pending - it is important to quarantine / isolate at home until your results are back. °If you test positive and would like further evaluation for persistent or worsening symptoms, you may schedule an E-visit or virtual (video) visit throughout the [facility] MyChart app or website. ° °PLEASE NOTE: If you develop severe chest pain or shortness of breath please go to the ER or call 9-1-1 for further evaluation --> DO NOT schedule electronic or virtual visits for this. °Please call our office for further guidance / recommendations as needed. ° °For information about the Covid vaccine, please visit [email or web address] °

## 2019-09-01 ENCOUNTER — Telehealth: Payer: Self-pay | Admitting: Physician Assistant

## 2019-09-01 LAB — SARS-COV-2, NAA 2 DAY TAT

## 2019-09-01 LAB — NOVEL CORONAVIRUS, NAA: SARS-CoV-2, NAA: NOT DETECTED

## 2019-09-01 NOTE — Telephone Encounter (Signed)
Patient called with concerns for infection taste to the mucus. She was seen yesterday by [place] Hall-Potvin, PA-C. Chart review showed no fever, shob. LCTAB. Discussed with patient, mucus likely post nasal drip. Can add otc flonase to regimen. If symptoms not improving, develop fever, shob, can follow up for reevaluation. Patient expresses understanding and agrees to plan.
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
## 2019-12-29 ENCOUNTER — Other Ambulatory Visit: Payer: Self-pay

## 2019-12-29 ENCOUNTER — Ambulatory Visit
Admission: EM | Admit: 2019-12-29 | Discharge: 2019-12-29 | Disposition: A | Discharge status: Home / Self Care | Payer: BC Managed Care – PPO | Attending: Family Medicine | Admitting: Family Medicine

## 2019-12-29 DIAGNOSIS — Z20822 Contact with and (suspected) exposure to covid-19: Secondary | ICD-10-CM | POA: Diagnosis not present
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
## 2019-12-29 NOTE — Discharge Instructions (Signed)

## 2020-01-02 ENCOUNTER — Ambulatory Visit
Admission: EM | Admit: 2020-01-02 | Discharge: 2020-01-02 | Disposition: A | Discharge status: Home / Self Care | Payer: BC Managed Care – PPO | Attending: Emergency Medicine | Admitting: Emergency Medicine

## 2020-01-02 DIAGNOSIS — R0981 Nasal congestion: Secondary | ICD-10-CM

## 2020-01-02 DIAGNOSIS — H6123 Impacted cerumen, bilateral: Secondary | ICD-10-CM

## 2020-01-02 DIAGNOSIS — Z1152 Encounter for screening for COVID-19: Secondary | ICD-10-CM

## 2020-01-02 LAB — NOVEL CORONAVIRUS, NAA

## 2020-01-02 MED ORDER — FLUTICASONE PROPIONATE 50 MCG/ACT NA SUSP: 1.0000 | Freq: Every day | NASAL | 0 refills | Status: DC

## 2020-01-02 NOTE — ED Triage Notes (Signed)
Pt c/o lt ear pain and nasal congestion since yesterday. Pt states her husband of the home is COVID positive.

## 2020-01-02 NOTE — Discharge Instructions (Signed)
Covid test pending, monitor my chart for results,  May take daily cetirizine/Claritin Flonase nasal spray 1 to 2 spray in each nostril daily Continue to keep an eye on your pain and symptoms, follow-up if not improving or worsening

## 2020-01-02 NOTE — ED Provider Notes (Signed)
[facility]    CSN: [id number] Arrival date & time: 01/02/20  0825      History   Chief Complaint Chief Complaint  Patient presents with  . Otalgia    HPI [name] is a 49 y.o. female history of prior tonsillectomy presenting today for evaluation of congestion and ear pain.  Patient reports that last night she began developed pain in her left ear.  She has had some mild nasal congestion and drainage associated with this.  Denies any associated sore throat cough or fevers.  Has had slight decrease in appetite along with mild abdominal pain, but unsure if this is related to her IBS.  Denies any nausea vomiting diarrhea or change in bowel movements.  Reports her husband tested positive for Covid last Thursday at home.   HPI  Past Medical History:  Diagnosis Date  . GERD (gastroesophageal reflux disease)   . Headache(784.0)   . Seasonal allergies     Patient Active Problem List   Diagnosis Date Noted  . Bunion 06/29/2018    Past Surgical History:  Procedure Laterality Date  . CESAREAN SECTION     X4  . kidney stones    . TONSILLECTOMY  08/04/2011   Procedure: TONSILLECTOMY;  Surgeon: Serena Colonel, MD;  Location: [facility];  Service: ENT;  Laterality: N/A;  . TONSILLECTOMY    . TUBAL LIGATION      OB History    Gravida  5   Para  3   Term  2   Preterm  1   AB  1   Living  5     SAB      TAB  1   Ectopic  0   Multiple  1   Live Births  5            Home Medications    Prior to Admission medications   Medication Sig Start Date End Date Taking? Authorizing Provider  albuterol (PROVENTIL HFA;VENTOLIN HFA) 108 (90 Base) MCG/ACT inhaler  03/29/18   [provider]  albuterol (VENTOLIN HFA) 108 (90 Base) MCG/ACT inhaler 1-2 inhalations every 4-6 hours as needed for wheezing. Dispense spacer as needed. 03/29/18 03/29/19  [provider]  amitriptyline (ELAVIL) 50 MG tablet Take 50 mg by mouth at  bedtime.    [provider]  benzonatate (TESSALON) 100 MG capsule Take 1 capsule (100 mg total) by mouth every 8 (eight) hours. 08/31/19   Hall-Potvin, [place], PA-C  brompheniramine-pseudoephedrine-DM 30-2-10 MG/5ML syrup  03/26/18   [provider]  clonazePAM (KLONOPIN) 0.5 MG tablet Take 0.5 mg by mouth daily as needed for anxiety.  03/01/18   [provider]  cyclobenzaprine (FLEXERIL) 10 MG tablet Take 1 tablet (10 mg total) by mouth 3 (three) times daily as needed for muscle spasms. 03/19/18   Azalia Bilis, MD  diclofenac sodium (VOLTAREN) 1 % GEL Apply 2 g topically 4 (four) times daily. Rub into affected area of foot 2 to 4 times daily 06/29/18   Vivi Barrack, DPM  dicyclomine (BENTYL) 20 MG tablet Take 1 tablet (20 mg total) by mouth 3 (three) times daily before meals. Patient taking differently: Take 20 mg by mouth 2 (two) times daily.  02/10/18   Brock Bad, MD  fluticasone (FLONASE) 50 MCG/ACT nasal spray Place 1-2 sprays into both nostrils daily. 01/02/20   Rube Sanchez C, PA-C  LINZESS 145 MCG CAPS capsule Take 145 mcg by mouth every morning. 06/09/18  [provider]  loratadine (CLARITIN) 10 MG tablet Take 1 tablet (10 mg total) by mouth daily. 08/31/19   Hall-Potvin, [place], PA-C  Multiple Vitamin (MULTIVITAMIN WITH MINERALS) TABS Take 1 tablet by mouth at bedtime.     [provider]  omeprazole (PRILOSEC) 10 MG capsule Take 10 mg by mouth daily.    [provider]  zolpidem (AMBIEN) 10 MG tablet Take 10 mg by mouth at bedtime.     [provider]  promethazine (PHENERGAN) 25 MG suppository Place 1 suppository (25 mg total) rectally every 6 (six) hours as needed for nausea. Patient not taking: Reported on 03/17/2018 08/04/11 08/31/19  Serena Colonel, MD  ranitidine (ZANTAC) 150 MG tablet Take 150 mg by mouth 2 (two) times daily.  08/31/19  [provider]    Family History Family History  Problem  Relation Age of Onset  . Diabetes Other   . Hypertension Other   . Cancer Mother   . Hypertension Father   . Diabetes Father   . Heart failure Father     Social History Social History   Tobacco Use  . Smoking status: Never Smoker  . Smokeless tobacco: Never Used  Vaping Use  . Vaping Use: Never used  Substance Use Topics  . Alcohol use: No  . Drug use: No     Allergies   Vicodin [hydrocodone-acetaminophen] and Penicillins   Review of Systems Review of Systems  Constitutional: Negative for activity change, appetite change, chills, fatigue and fever.  HENT: Positive for congestion, ear pain and rhinorrhea. Negative for sinus pressure, sore throat and trouble swallowing.   Eyes: Negative for discharge and redness.  Respiratory: Negative for cough, chest tightness and shortness of breath.   Cardiovascular: Negative for chest pain.  Gastrointestinal: Negative for abdominal pain, diarrhea, nausea and vomiting.  Musculoskeletal: Negative for myalgias.  Skin: Negative for rash.  Neurological: Negative for dizziness, light-headedness and headaches.     Physical Exam Triage Vital Signs ED Triage Vitals  Enc Vitals Group     BP 01/02/20 0931 130/88     Pulse Rate 01/02/20 0931 97     Resp 01/02/20 0931 16     Temp 01/02/20 0931 98.2 F (36.8 C)     Temp Source 01/02/20 0931 Oral     SpO2 01/02/20 0931 95 %     Weight --      Height --      Head Circumference --      Peak Flow --      Pain Score 01/02/20 0938 0     Pain Loc --      Pain Edu? --      Excl. in GC? --    No data found.  Updated Vital Signs BP 130/88 (BP Location: Left Arm)   Pulse 97   Temp 98.2 F (36.8 C) (Oral)   Resp 16   SpO2 95%   Visual Acuity Right Eye Distance:   Left Eye Distance:   Bilateral Distance:    Right Eye Near:   Left Eye Near:    Bilateral Near:     Physical Exam Vitals and nursing note reviewed.  Constitutional:      Appearance: She is well-developed.      Comments: No acute distress  HENT:     Head: Normocephalic and atraumatic.     Ears:     Comments: Bilateral cerumen impaction, TMs not visualized initially  After irrigation TMs intact pearly gray and with good  cone of light; cerumen impaction completely resolved    Nose: Nose normal.     Mouth/Throat:     Comments: Oral mucosa pink and moist, no tonsillar enlargement or exudate. Posterior pharynx patent and nonerythematous, no uvula deviation or swelling. Normal phonation. Eyes:     Conjunctiva/sclera: Conjunctivae normal.  Cardiovascular:     Rate and Rhythm: Normal rate.  Pulmonary:     Effort: Pulmonary effort is normal. No respiratory distress.     Comments: Breathing comfortably at rest, CTABL, no wheezing, rales or other adventitious sounds auscultated Abdominal:     General: There is no distension.  Musculoskeletal:        General: Normal range of motion.     Cervical back: Neck supple.  Skin:    General: Skin is warm and dry.  Neurological:     Mental Status: She is alert and oriented to person, place, and time.      UC Treatments / Results  Labs (all labs ordered are listed, but only abnormal results are displayed) Labs Reviewed  NOVEL CORONAVIRUS, NAA    EKG   Radiology No results found.  Procedures Procedures (including critical care time)  Medications Ordered in UC Medications - No data to display  Initial Impression / Assessment and Plan / UC Course  I have reviewed the triage vital signs and the nursing notes.  Pertinent labs & imaging results that were available during my care of the patient were reviewed by me and considered in my medical decision making (see chart for details).     Covid test pending, recommending quarantining given Covid exposure at home.  Cerumen impaction by irrigation by nursing staff with complete removal, improvement in hearing, mild discomfort still remaining in left ear, possible underlying eustachian tube  dysfunction.  Recommending symptomatic and supportive care for URI symptoms.  Rest and fluids.  Discussed strict return precautions. Patient verbalized understanding and is agreeable with plan.  Final Clinical Impressions(s) / UC Diagnoses   Final diagnoses:  Encounter for screening for COVID-19  Bilateral impacted cerumen  Nasal congestion     Discharge Instructions     Covid test pending, monitor my chart for results,  May take daily cetirizine/Claritin Flonase nasal spray 1 to 2 spray in each nostril daily Continue to keep an eye on your pain and symptoms, follow-up if not improving or worsening    ED Prescriptions    Medication Sig Dispense Auth. Provider   fluticasone (FLONASE) 50 MCG/ACT nasal spray Place 1-2 sprays into both nostrils daily. 16 g Jeanny Rymer, [place] C, PA-C     PDMP not reviewed this encounter.   Sidonie Dexheimer, [place] C, PA-C 01/02/20 1016

## 2020-01-04 LAB — NOVEL CORONAVIRUS, NAA: SARS-CoV-2, NAA: NOT DETECTED

## 2020-01-05 ENCOUNTER — Other Ambulatory Visit: Payer: Self-pay

## 2020-01-05 ENCOUNTER — Ambulatory Visit
Admission: RE | Admit: 2020-01-05 | Discharge: 2020-01-05 | Disposition: A | Discharge status: Home / Self Care | Payer: BC Managed Care – PPO | Source: Ambulatory Visit | Attending: Emergency Medicine | Admitting: Emergency Medicine

## 2020-01-05 VITALS — BP 123/85 | HR 87 | Temp 98.6°F | Resp 16

## 2020-01-05 DIAGNOSIS — Z1152 Encounter for screening for COVID-19: Secondary | ICD-10-CM | POA: Diagnosis not present
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
## 2020-01-05 NOTE — Discharge Instructions (Signed)

## 2020-01-07 LAB — NOVEL CORONAVIRUS, NAA: SARS-CoV-2, NAA: NOT DETECTED

## 2020-01-20 ENCOUNTER — Ambulatory Visit
Admission: EM | Admit: 2020-01-20 | Discharge: 2020-01-20 | Disposition: A | Discharge status: Home / Self Care | Payer: BC Managed Care – PPO | Attending: Physician Assistant | Admitting: Physician Assistant

## 2020-01-20 DIAGNOSIS — Z1152 Encounter for screening for COVID-19: Secondary | ICD-10-CM

## 2020-01-20 DIAGNOSIS — R0981 Nasal congestion: Secondary | ICD-10-CM

## 2020-01-20 DIAGNOSIS — R059 Cough, unspecified: Secondary | ICD-10-CM

## 2020-01-20 MED ORDER — AZELASTINE HCL 0.1 % NA SOLN: 2.0000 | Freq: Two times a day (BID) | NASAL | 0 refills | Status: AC

## 2020-01-20 NOTE — ED Provider Notes (Signed)
[facility]    CSN: [id number] Arrival date & time: 01/20/20  0948      History   Chief Complaint Chief Complaint  Patient presents with  . Cough  . Nasal Congestion    HPI [name] is a 49 y.o. female.   49 year old female comes in for 4 day of URI symptoms. Cough, nasal congestion. Denies fever, chills, body aches. Generalized abdominal pain that started this morning that is constant, aching pain. Denies nausea/vomiting, diarrhea. Does have constipation. Denies shortness of breath, loss of taste/smell. Denies urinary symptoms. Denies vaginal symptoms. Never smoker.      Past Medical History:  Diagnosis Date  . GERD (gastroesophageal reflux disease)   . Headache(784.0)   . Seasonal allergies     Patient Active Problem List   Diagnosis Date Noted  . Bunion 06/29/2018    Past Surgical History:  Procedure Laterality Date  . CESAREAN SECTION     X4  . kidney stones    . TONSILLECTOMY  08/04/2011   Procedure: TONSILLECTOMY;  Surgeon: Serena Colonel, MD;  Location: [facility];  Service: ENT;  Laterality: N/A;  . TONSILLECTOMY    . TUBAL LIGATION      OB History    Gravida  5   Para  3   Term  2   Preterm  1   AB  1   Living  5     SAB      TAB  1   Ectopic  0   Multiple  1   Live Births  5            Home Medications    Prior to Admission medications   Medication Sig Start Date End Date Taking? Authorizing Provider  albuterol (PROVENTIL HFA;VENTOLIN HFA) 108 (90 Base) MCG/ACT inhaler  03/29/18   [provider]  albuterol (VENTOLIN HFA) 108 (90 Base) MCG/ACT inhaler 1-2 inhalations every 4-6 hours as needed for wheezing. Dispense spacer as needed. 03/29/18 03/29/19  [provider]  amitriptyline (ELAVIL) 50 MG tablet Take 50 mg by mouth at bedtime.    [provider]  azelastine (ASTELIN) 0.1 % nasal spray Place 2 sprays into both nostrils 2 (two) times daily. 01/20/20   Cathie Hoops, Libia Fazzini V,  PA-C  benzonatate (TESSALON) 100 MG capsule Take 1 capsule (100 mg total) by mouth every 8 (eight) hours. 08/31/19   Hall-Potvin, [place], PA-C  brompheniramine-pseudoephedrine-DM 30-2-10 MG/5ML syrup  03/26/18   [provider]  clonazePAM (KLONOPIN) 0.5 MG tablet Take 0.5 mg by mouth daily as needed for anxiety.  03/01/18   [provider]  cyclobenzaprine (FLEXERIL) 10 MG tablet Take 1 tablet (10 mg total) by mouth 3 (three) times daily as needed for muscle spasms. 03/19/18   Azalia Bilis, MD  diclofenac sodium (VOLTAREN) 1 % GEL Apply 2 g topically 4 (four) times daily. Rub into affected area of foot 2 to 4 times daily 06/29/18   Vivi Barrack, DPM  dicyclomine (BENTYL) 20 MG tablet Take 1 tablet (20 mg total) by mouth 3 (three) times daily before meals. Patient taking differently: Take 20 mg by mouth 2 (two) times daily.  02/10/18   Brock Bad, MD  fluticasone (FLONASE) 50 MCG/ACT nasal spray Place 1-2 sprays into both nostrils daily. 01/02/20   Wieters, Hallie C, PA-C  LINZESS 145 MCG CAPS capsule Take 145 mcg by mouth every morning. 06/09/18   [provider]  loratadine (CLARITIN) 10  MG tablet Take 1 tablet (10 mg total) by mouth daily. 08/31/19   Hall-Potvin, [place], PA-C  Multiple Vitamin (MULTIVITAMIN WITH MINERALS) TABS Take 1 tablet by mouth at bedtime.     [provider]  omeprazole (PRILOSEC) 10 MG capsule Take 10 mg by mouth daily.    [provider]  zolpidem (AMBIEN) 10 MG tablet Take 10 mg by mouth at bedtime.     [provider]  promethazine (PHENERGAN) 25 MG suppository Place 1 suppository (25 mg total) rectally every 6 (six) hours as needed for nausea. Patient not taking: Reported on 03/17/2018 08/04/11 08/31/19  Serena Colonel, MD  ranitidine (ZANTAC) 150 MG tablet Take 150 mg by mouth 2 (two) times daily.  08/31/19  [provider]    Family History Family History  Problem Relation Age of Onset  .  Diabetes Other   . Hypertension Other   . Cancer Mother   . Hypertension Father   . Diabetes Father   . Heart failure Father     Social History Social History   Tobacco Use  . Smoking status: Never Smoker  . Smokeless tobacco: Never Used  Vaping Use  . Vaping Use: Never used  Substance Use Topics  . Alcohol use: No  . Drug use: No     Allergies   Vicodin [hydrocodone-acetaminophen] and Penicillins   Review of Systems Review of Systems  Reason unable to perform ROS: See HPI as above.     Physical Exam Triage Vital Signs ED Triage Vitals  Enc Vitals Group     BP 01/20/20 1016 129/83     Pulse Rate 01/20/20 1016 93     Resp 01/20/20 1016 16     Temp 01/20/20 1016 98.2 F (36.8 C)     Temp Source 01/20/20 1016 Oral     SpO2 01/20/20 1016 100 %     Weight --      Height --      Head Circumference --      Peak Flow --      Pain Score 01/20/20 1019 0     Pain Loc --      Pain Edu? --      Excl. in GC? --    No data found.  Updated Vital Signs BP 129/83 (BP Location: Left Arm)   Pulse 93   Temp 98.2 F (36.8 C) (Oral)   Resp 16   SpO2 100%      Physical Exam Constitutional:      General: She is not in acute distress.    Appearance: She is well-developed. She is not ill-appearing, toxic-appearing or diaphoretic.  HENT:     Head: Normocephalic and atraumatic.     Right Ear: Tympanic membrane, ear canal and external ear normal.     Left Ear: Tympanic membrane, ear canal and external ear normal.     Nose:     Right Sinus: No maxillary sinus tenderness or frontal sinus tenderness.     Left Sinus: No maxillary sinus tenderness or frontal sinus tenderness.  Eyes:     Conjunctiva/sclera: Conjunctivae normal.     Pupils: Pupils are equal, round, and reactive to light.  Cardiovascular:     Rate and Rhythm: Normal rate and regular rhythm.  Pulmonary:     Effort: Pulmonary effort is normal. No respiratory distress.     Comments: LCTAB Abdominal:      General: Bowel sounds are normal.     Palpations: Abdomen is soft.  Tenderness: There is no abdominal tenderness. There is no right CVA tenderness, left CVA tenderness, guarding or rebound.  Musculoskeletal:     Cervical back: Normal range of motion and neck supple.  Skin:    General: Skin is warm and dry.  Neurological:     Mental Status: She is alert and oriented to person, place, and time.  Psychiatric:        Behavior: Behavior normal.        Judgment: Judgment normal.      UC Treatments / Results  Labs (all labs ordered are listed, but only abnormal results are displayed) Labs Reviewed  NOVEL CORONAVIRUS, NAA    EKG   Radiology No results found.  Procedures Procedures (including critical care time)  Medications Ordered in UC Medications - No data to display  Initial Impression / Assessment and Plan / UC Course  I have reviewed the triage vital signs and the nursing notes.  Pertinent labs & imaging results that were available during my care of the patient were reviewed by me and considered in my medical decision making (see chart for details).    COVID PCR test ordered. Patient to quarantine until testing results return. No alarming signs on exam. LCTAB. Symptomatic treatment discussed.  Push fluids.  Return precautions given.  Patient expresses understanding and agrees to plan.  Final Clinical Impressions(s) / UC Diagnoses   Final diagnoses:  Encounter for screening for COVID-19  Nasal congestion  Cough    ED Prescriptions    Medication Sig Dispense Auth. Provider   azelastine (ASTELIN) 0.1 % nasal spray Place 2 sprays into both nostrils 2 (two) times daily. 30 mL Belinda Fisher, PA-C     PDMP not reviewed this encounter.   Belinda Fisher, PA-C 01/20/20 1040

## 2020-01-20 NOTE — Discharge Instructions (Signed)
COVID PCR testing ordered. I would like you to quarantine until testing results. Can restart tessalon/albuterol for cough. Continue flonase, add azelastine as directed. Tylenol/motrin for pain and fever. Keep hydrated, urine should be clear to pale yellow in color. If experiencing shortness of breath, trouble breathing, go to the emergency department for further evaluation needed.

## 2020-01-20 NOTE — ED Triage Notes (Signed)
Pt present cough with nasal congestion. Symptoms started about four days ago. Pt has been taking otc medication with no relief.

## 2020-01-23 LAB — NOVEL CORONAVIRUS, NAA: SARS-CoV-2, NAA: NOT DETECTED

## 2020-02-27 ENCOUNTER — Telehealth: Payer: Self-pay | Admitting: *Deleted

## 2020-02-27 NOTE — Telephone Encounter (Signed)
Pt called to office stating she may need some of her records/labs sent to another office.   Return call to pt.  Informed pt to come by the office and sign a ROI form and her records could be sent out.

## 2020-04-24 ENCOUNTER — Emergency Department (HOSPITAL_COMMUNITY): Payer: BC Managed Care – PPO

## 2020-04-24 ENCOUNTER — Emergency Department (HOSPITAL_COMMUNITY)
Admission: EM | Admit: 2020-04-24 | Discharge: 2020-04-24 | Disposition: A | Discharge status: Home / Self Care | Payer: BC Managed Care – PPO | Attending: Emergency Medicine | Admitting: Emergency Medicine

## 2020-04-24 ENCOUNTER — Other Ambulatory Visit: Payer: Self-pay

## 2020-04-24 DIAGNOSIS — R0602 Shortness of breath: Secondary | ICD-10-CM | POA: Insufficient documentation

## 2020-04-24 DIAGNOSIS — Z5321 Procedure and treatment not carried out due to patient leaving prior to being seen by health care provider: Secondary | ICD-10-CM | POA: Insufficient documentation

## 2020-04-24 DIAGNOSIS — R079 Chest pain, unspecified: Secondary | ICD-10-CM | POA: Insufficient documentation

## 2020-04-24 LAB — BASIC METABOLIC PANEL
Anion gap: 11 (ref 5–15)
BUN: 16 mg/dL (ref 6–20)
CO2: 24 mmol/L (ref 22–32)
Calcium: 9.9 mg/dL (ref 8.9–10.3)
Chloride: 99 mmol/L (ref 98–111)
Creatinine, Ser: 1.11 mg/dL — ABNORMAL HIGH (ref 0.44–1.00)
GFR, Estimated: >60 mL/min (ref >60)
Glucose, Bld: 297 mg/dL — ABNORMAL HIGH (ref 70–99)
Potassium: 3.7 mmol/L (ref 3.5–5.1)
Sodium: 134 mmol/L — ABNORMAL LOW (ref 135–145)

## 2020-04-24 LAB — CBC
HCT: 39.4 % (ref 36.0–46.0)
Hemoglobin: 14.1 g/dL (ref 12.0–15.0)
MCH: 32.6 pg (ref 26.0–34.0)
MCHC: 35.8 g/dL (ref 30.0–36.0)
MCV: 91 fL (ref 80.0–100.0)
Platelets: 391 10*3/uL (ref 150–400)
RBC: 4.33 MIL/uL (ref 3.87–5.11)
RDW: 12.7 % (ref 11.5–15.5)
WBC: 14.4 10*3/uL — ABNORMAL HIGH (ref 4.0–10.5)
nRBC: 0 % (ref 0.0–0.2)

## 2020-04-24 LAB — TROPONIN I (HIGH SENSITIVITY): Troponin I (High Sensitivity): 5 ng/L (ref <18)

## 2020-04-24 NOTE — ED Triage Notes (Signed)
To ED via [facility] , chest pain, without any exertion, also short of breath- Squeezing type pain, received NTG x1, ASA 324mg  [er EMS

## 2020-04-24 NOTE — ED Triage Notes (Incomplete Revision)
To ED via [facility] , chest pain, without any exertion, also short of breath- Squeezing type pain, received NTG x1, ASA 324mg  [er EMS   No hx of chest pain,

## 2020-04-24 NOTE — ED Notes (Signed)
Pt stated that she did no longer want to wait and was going to leave.

## 2020-05-06 ENCOUNTER — Ambulatory Visit: Payer: Self-pay

## 2020-05-16 DIAGNOSIS — M4802 Spinal stenosis, cervical region: Secondary | ICD-10-CM | POA: Insufficient documentation

## 2020-05-16 DIAGNOSIS — M5020 Other cervical disc displacement, unspecified cervical region: Secondary | ICD-10-CM | POA: Insufficient documentation

## 2020-06-19 ENCOUNTER — Other Ambulatory Visit: Payer: Self-pay | Admitting: Internal Medicine

## 2020-06-19 DIAGNOSIS — Z1231 Encounter for screening mammogram for malignant neoplasm of breast: Secondary | ICD-10-CM

## 2020-06-21 ENCOUNTER — Encounter: Payer: Self-pay | Admitting: Obstetrics

## 2020-07-23 ENCOUNTER — Other Ambulatory Visit: Payer: Self-pay | Admitting: Neurosurgery

## 2020-08-08 ENCOUNTER — Inpatient Hospital Stay: Admission: RE | Admit: 2020-08-08 | Payer: Self-pay | Source: Ambulatory Visit

## 2020-09-28 ENCOUNTER — Other Ambulatory Visit: Payer: Self-pay

## 2020-09-28 ENCOUNTER — Ambulatory Visit
Admission: RE | Admit: 2020-09-28 | Discharge: 2020-09-28 | Disposition: A | Discharge status: Home / Self Care | Payer: BC Managed Care – PPO | Source: Ambulatory Visit | Attending: Internal Medicine | Admitting: Internal Medicine

## 2020-09-28 ENCOUNTER — Ambulatory Visit: Admit: 2020-09-28 | Payer: Self-pay | Admitting: Neurosurgery

## 2020-09-28 DIAGNOSIS — Z1231 Encounter for screening mammogram for malignant neoplasm of breast: Secondary | ICD-10-CM

## 2020-09-28 SURGERY — ANTERIOR CERVICAL DECOMPRESSION/DISCECTOMY FUSION 1 LEVEL
Anesthesia: General

## 2020-11-01 ENCOUNTER — Encounter: Payer: Self-pay | Admitting: Obstetrics

## 2020-11-01 ENCOUNTER — Ambulatory Visit (INDEPENDENT_AMBULATORY_CARE_PROVIDER_SITE_OTHER): Payer: BC Managed Care – PPO | Admitting: Obstetrics

## 2020-11-01 ENCOUNTER — Other Ambulatory Visit: Payer: Self-pay

## 2020-11-01 ENCOUNTER — Other Ambulatory Visit (HOSPITAL_COMMUNITY)
Admission: RE | Admit: 2020-11-01 | Discharge: 2020-11-01 | Disposition: A | Discharge status: Home / Self Care | Payer: BC Managed Care – PPO | Source: Ambulatory Visit | Attending: Obstetrics | Admitting: Obstetrics

## 2020-11-01 VITALS — BP 126/86 | HR 84 | Ht 64.0 in | Wt 176.2 lb

## 2020-11-01 DIAGNOSIS — R8290 Unspecified abnormal findings in urine: Secondary | ICD-10-CM

## 2020-11-01 DIAGNOSIS — Z01419 Encounter for gynecological examination (general) (routine) without abnormal findings: Secondary | ICD-10-CM | POA: Insufficient documentation

## 2020-11-01 DIAGNOSIS — N898 Other specified noninflammatory disorders of vagina: Secondary | ICD-10-CM | POA: Diagnosis not present

## 2020-11-01 DIAGNOSIS — Z113 Encounter for screening for infections with a predominantly sexual mode of transmission: Secondary | ICD-10-CM

## 2020-11-01 DIAGNOSIS — K581 Irritable bowel syndrome with constipation: Secondary | ICD-10-CM

## 2020-11-01 NOTE — Progress Notes (Signed)
Subjective:        [name] is a 50 y.o. female here for a routine exam.  Current complaints: Malodorous urine and vaginal discharge.    Personal health questionnaire:  Is patient [name], have a family history of breast and/or ovarian cancer: no Is there a family history of uterine cancer diagnosed at age < 16, gastrointestinal cancer, urinary tract cancer, family member who is a Personnel officer syndrome-associated carrier: no Is the patient overweight and hypertensive, family history of diabetes, personal history of gestational diabetes, preeclampsia or PCOS: no Is patient over 89, have PCOS,  family history of premature CHD under age 67, diabetes, smoke, have hypertension or peripheral artery disease:  no At any time, has a partner hit, kicked or otherwise hurt or frightened you?: no Over the past 2 weeks, have you felt down, depressed or hopeless?: no Over the past 2 weeks, have you felt little interest or pleasure in doing things?:no   Gynecologic History Patient's last menstrual period was 02/14/2018. Contraception: post menopausal status Last Pap: 2020. Results were: normal Last mammogram: 09-28-2020. Results were: normal  Obstetric History OB History  Gravida Para Term Preterm AB Living  5 3 2 1 1 5   SAB IAB Ectopic Multiple Live Births    1 0 1 5    # Outcome Date GA Lbr Len/2nd Weight Sex Delivery Anes PTL Lv  5 Term 10/07/04     CS-LTranv   LIV  4A Gravida 01/03/02     CS-LTranv   LIV  4B Preterm 01/13/02     CS-LTranv   LIV  3 Term 01/09/00     CS-LTranv   LIV  2 Gravida 06/08/91     CS-LTranv   LIV  1 IAB             Past Medical History:  Diagnosis Date   GERD (gastroesophageal reflux disease)    Headache(784.0)    Seasonal allergies     Past Surgical History:  Procedure Laterality Date   CESAREAN SECTION     X4   kidney stones     TONSILLECTOMY  08/04/2011   Procedure: TONSILLECTOMY;  Surgeon: 10/04/2011, MD;  Location: [facility];  Service: ENT;  Laterality: N/A;   TONSILLECTOMY     TUBAL LIGATION       Current Outpatient Medications:    albuterol (PROVENTIL HFA;VENTOLIN HFA) 108 (90 Base) MCG/ACT inhaler, , Disp: , Rfl:    amitriptyline (ELAVIL) 50 MG tablet, Take 50 mg by mouth at bedtime., Disp: , Rfl:    azelastine (ASTELIN) 0.1 % nasal spray, Place 2 sprays into both nostrils 2 (two) times daily., Disp: 30 mL, Rfl: 0   clonazePAM (KLONOPIN) 0.5 MG tablet, Take 0.5 mg by mouth daily as needed for anxiety. , Disp: , Rfl: 2   cyclobenzaprine (FLEXERIL) 10 MG tablet, Take 1 tablet (10 mg total) by mouth 3 (three) times daily as needed for muscle spasms., Disp: 12 tablet, Rfl: 0   dicyclomine (BENTYL) 20 MG tablet, Take 1 tablet (20 mg total) by mouth 3 (three) times daily before meals. (Patient taking differently: Take 20 mg by mouth 2 (two) times daily.), Disp: 90 tablet, Rfl: 5   escitalopram (LEXAPRO) 10 MG tablet, Take 10 mg by mouth daily., Disp: , Rfl:    fluticasone (FLONASE) 50 MCG/ACT nasal spray, Place 1-2 sprays into both nostrils daily., Disp: 16 g, Rfl: 0   LINZESS 145 MCG CAPS capsule, Take 145 mcg  by mouth every morning., Disp: , Rfl:    loratadine (CLARITIN) 10 MG tablet, Take 1 tablet (10 mg total) by mouth daily., Disp: 30 tablet, Rfl: 0   Multiple Vitamin (MULTIVITAMIN WITH MINERALS) TABS, Take 1 tablet by mouth at bedtime. , Disp: , Rfl:    naproxen (NAPROSYN) 500 MG tablet, Take 500 mg by mouth 2 (two) times daily., Disp: , Rfl:    omeprazole (PRILOSEC) 10 MG capsule, Take 10 mg by mouth daily., Disp: , Rfl:    zolpidem (AMBIEN) 10 MG tablet, Take 10 mg by mouth at bedtime. , Disp: , Rfl:    albuterol (VENTOLIN HFA) 108 (90 Base) MCG/ACT inhaler, 1-2 inhalations every 4-6 hours as needed for wheezing. Dispense spacer as needed., Disp: , Rfl:    benzonatate (TESSALON) 100 MG capsule, Take 1 capsule (100 mg total) by mouth every 8 (eight) hours., Disp: 21 capsule, Rfl: 0    brompheniramine-pseudoephedrine-DM 30-2-10 MG/5ML syrup, , Disp: , Rfl:    diclofenac sodium (VOLTAREN) 1 % GEL, Apply 2 g topically 4 (four) times daily. Rub into affected area of foot 2 to 4 times daily (Patient not taking: Reported on 11/01/2020), Disp: 100 g, Rfl: 2 Allergies  Allergen Reactions   Vicodin [Hydrocodone-Acetaminophen] Itching   Penicillins Rash    Has patient had a PCN reaction causing immediate rash, facial/tongue/throat swelling, SOB or lightheadedness with hypotension: Yes Has patient had a PCN reaction causing severe rash involving mucus membranes or skin necrosis: No Has patient had a PCN reaction that required hospitalization: No Has patient had a PCN reaction occurring within the last 10 years: No If all of the above answers are "NO", then may proceed with Cephalosporin use.     Social History   Tobacco Use   Smoking status: Never   Smokeless tobacco: Never  Substance Use Topics   Alcohol use: No    Family History  Problem Relation Age of Onset   Diabetes Other    Hypertension Other    Cancer Mother    Hypertension Father    Diabetes Father    Heart failure Father       Review of Systems  Constitutional: negative for fatigue and weight loss Respiratory: negative for cough and wheezing Cardiovascular: negative for chest pain, fatigue and palpitations Gastrointestinal: negative for abdominal pain and change in bowel habits Musculoskeletal:negative for myalgias Neurological: negative for gait problems and tremors Behavioral/Psych: negative for abusive relationship, depression Endocrine: negative for temperature intolerance    Genitourinary:negative for abnormal menstrual periods, genital lesions, hot flashes, sexual problems and vaginal discharge Integument/breast: negative for breast lump, breast tenderness, nipple discharge and skin lesion(s)    Objective:       BP 126/86   Pulse 84   Ht 5\' 4"  (1.626 m)   Wt 176 lb 3.2 oz (79.9 kg)   LMP  02/14/2018   BMI 30.24 kg/m  General:   alert  Skin:   no rash or abnormalities  Lungs:   clear to auscultation bilaterally  Heart:   regular rate and rhythm, S1, S2 normal, no murmur, click, rub or gallop  Breasts:   normal without suspicious masses, skin or nipple changes or axillary nodes  Abdomen:  normal findings: no organomegaly, soft, non-tender and no hernia  Pelvis:  External genitalia: normal general appearance Urinary system: urethral meatus normal and bladder without fullness, nontender Vaginal: normal without tenderness, induration or masses Cervix: normal appearance Adnexa: normal bimanual exam Uterus: anteverted and non-tender, normal size  Lab Review Urine pregnancy test Labs reviewed yes Radiologic studies reviewed yes  I have spent a total of 20 minutes of face-to-face time, excluding clinical staff time, reviewing notes and preparing to see patient, ordering tests and/or medications, and counseling the patient.   Assessment:    1. Encounter for gynecological examination with Papanicolaou smear of cervix Rx: - Cytology - PAP( [facility])  2. Vaginal discharge Rx: - Cervicovaginal ancillary only( [facility])  3. Screening for STD (sexually transmitted disease) Rx: - Hepatitis B surface antigen - Hepatitis C antibody - HIV Antibody (routine testing w rflx) - RPR  4. Malodorous urine Rx: - POCT Urinalysis Dipstick - Urine Culture  5. Irritable bowel syndrome with constipation -- managed by PCP, taking linzess - colace recommended along with increasing fluids and fiber    Plan:    Education reviewed: calcium supplements, depression evaluation, low fat, low cholesterol diet, safe sex/STD prevention, self breast exams, and weight bearing exercise. Follow up in: 1 year.     Orders Placed This Encounter  Procedures   Urine Culture   Hepatitis B surface antigen   Hepatitis C antibody   HIV Antibody (routine testing w rflx)   RPR   POCT  Urinalysis Dipstick    Brock Bad, MD 11/01/2020 3:37 PM      Pt presents for annual and pap. Pt changed her mind and wants all STD testing.  Pt c/o malodorous urine. Normal mammogram 10/01/20

## 2020-11-06 ENCOUNTER — Other Ambulatory Visit: Payer: Self-pay | Admitting: Obstetrics

## 2020-11-06 DIAGNOSIS — N3000 Acute cystitis without hematuria: Secondary | ICD-10-CM

## 2020-11-06 LAB — CERVICOVAGINAL ANCILLARY ONLY
Bacterial Vaginitis (gardnerella): NEGATIVE
Candida Glabrata: NEGATIVE
Candida Vaginitis: NEGATIVE
Chlamydia: NEGATIVE
Comment: NEGATIVE
Comment: NEGATIVE
Comment: NEGATIVE
Comment: NEGATIVE
Comment: NEGATIVE
Comment: NORMAL
Neisseria Gonorrhea: NEGATIVE
Trichomonas: NEGATIVE

## 2020-11-06 LAB — URINE CULTURE

## 2020-11-06 LAB — CYTOLOGY - PAP
Comment: NEGATIVE
Diagnosis: NEGATIVE
High risk HPV: NEGATIVE

## 2020-11-06 MED ORDER — CEFUROXIME AXETIL 500 MG PO TABS: 500.0000 mg | ORAL_TABLET | Freq: Two times a day (BID) | ORAL | 0 refills | Status: DC

## 2020-11-07 ENCOUNTER — Telehealth: Payer: Self-pay | Admitting: *Deleted

## 2020-11-07 NOTE — Telephone Encounter (Signed)
Pt called to office stating that Dr [name] sent in an antibiotic for +UTI. Pt states that medication is related to penicillin which she is allergic.  Pt would like to know if she can get a different antibiotic.    Please review and advise.

## 2020-11-09 ENCOUNTER — Encounter: Payer: Self-pay | Admitting: *Deleted

## 2020-11-09 NOTE — Telephone Encounter (Signed)
Per Dr Nigel Bridgeman is safe to take.  Very small chance of cross reaction to cause rash like penicillin.   Msg to be sent via mychart.

## 2021-03-12 ENCOUNTER — Encounter (HOSPITAL_BASED_OUTPATIENT_CLINIC_OR_DEPARTMENT_OTHER): Payer: Self-pay | Admitting: Emergency Medicine

## 2021-03-12 ENCOUNTER — Emergency Department (HOSPITAL_BASED_OUTPATIENT_CLINIC_OR_DEPARTMENT_OTHER)
Admission: EM | Admit: 2021-03-12 | Discharge: 2021-03-12 | Disposition: A | Discharge status: Home / Self Care | Payer: BC Managed Care – PPO | Attending: Emergency Medicine | Admitting: Emergency Medicine

## 2021-03-12 ENCOUNTER — Emergency Department (HOSPITAL_BASED_OUTPATIENT_CLINIC_OR_DEPARTMENT_OTHER): Payer: BC Managed Care – PPO

## 2021-03-12 ENCOUNTER — Other Ambulatory Visit: Payer: Self-pay

## 2021-03-12 DIAGNOSIS — K529 Noninfective gastroenteritis and colitis, unspecified: Secondary | ICD-10-CM | POA: Insufficient documentation

## 2021-03-12 DIAGNOSIS — Z20822 Contact with and (suspected) exposure to covid-19: Secondary | ICD-10-CM | POA: Insufficient documentation

## 2021-03-12 DIAGNOSIS — Z7952 Long term (current) use of systemic steroids: Secondary | ICD-10-CM | POA: Diagnosis not present

## 2021-03-12 DIAGNOSIS — R109 Unspecified abdominal pain: Secondary | ICD-10-CM | POA: Diagnosis present

## 2021-03-12 HISTORY — DX: Irritable bowel syndrome, unspecified: K58.9

## 2021-03-12 LAB — URINALYSIS, ROUTINE W REFLEX MICROSCOPIC
Bilirubin Urine: NEGATIVE
Glucose, UA: NEGATIVE mg/dL
Hgb urine dipstick: NEGATIVE
Ketones, ur: NEGATIVE mg/dL
Leukocytes,Ua: NEGATIVE
Nitrite: NEGATIVE
Specific Gravity, Urine: 1.027 (ref 1.005–1.030)
pH: 8 (ref 5.0–8.0)

## 2021-03-12 LAB — RESP PANEL BY RT-PCR (FLU A&B, COVID) ARPGX2
Influenza A by PCR: NEGATIVE
Influenza B by PCR: NEGATIVE
SARS Coronavirus 2 by RT PCR: NEGATIVE

## 2021-03-12 LAB — CBC
HCT: 39.8 % (ref 36.0–46.0)
Hemoglobin: 13.9 g/dL (ref 12.0–15.0)
MCH: 31.2 pg (ref 26.0–34.0)
MCHC: 34.9 g/dL (ref 30.0–36.0)
MCV: 89.2 fL (ref 80.0–100.0)
Platelets: 314 10*3/uL (ref 150–400)
RBC: 4.46 MIL/uL (ref 3.87–5.11)
RDW: 12.3 % (ref 11.5–15.5)
WBC: 10 10*3/uL (ref 4.0–10.5)
nRBC: 0 % (ref 0.0–0.2)

## 2021-03-12 LAB — OCCULT BLOOD X 1 CARD TO LAB, STOOL: Fecal Occult Bld: POSITIVE — AB

## 2021-03-12 LAB — COMPREHENSIVE METABOLIC PANEL
ALT: 40 U/L (ref 0–44)
AST: 26 U/L (ref 15–41)
Albumin: 4.6 g/dL (ref 3.5–5.0)
Alkaline Phosphatase: 95 U/L (ref 38–126)
Anion gap: 11 (ref 5–15)
BUN: 13 mg/dL (ref 6–20)
CO2: 21 mmol/L — ABNORMAL LOW (ref 22–32)
Calcium: 10.1 mg/dL (ref 8.9–10.3)
Chloride: 106 mmol/L (ref 98–111)
Creatinine, Ser: 1.06 mg/dL — ABNORMAL HIGH (ref 0.44–1.00)
GFR, Estimated: >60 mL/min (ref >60)
Glucose, Bld: 180 mg/dL — ABNORMAL HIGH (ref 70–99)
Potassium: 3.7 mmol/L (ref 3.5–5.1)
Sodium: 138 mmol/L (ref 135–145)
Total Bilirubin: 0.5 mg/dL (ref 0.3–1.2)
Total Protein: 7.8 g/dL (ref 6.5–8.1)

## 2021-03-12 LAB — LIPASE, BLOOD: Lipase: 30 U/L (ref 11–51)

## 2021-03-12 LAB — PREGNANCY, URINE: Preg Test, Ur: NEGATIVE

## 2021-03-12 MED ORDER — METRONIDAZOLE 500 MG PO TABS: 500.0000 mg | ORAL_TABLET | Freq: Two times a day (BID) | ORAL | 0 refills | Status: DC

## 2021-03-12 MED ORDER — IOHEXOL 300 MG/ML  SOLN
100.0000 mL | Freq: Once | INTRAMUSCULAR | Status: AC | PRN
  Administered 2021-03-12: 80 mL via INTRAVENOUS

## 2021-03-12 MED ORDER — CIPROFLOXACIN HCL 500 MG PO TABS: 500.0000 mg | ORAL_TABLET | Freq: Two times a day (BID) | ORAL | 0 refills | Status: AC

## 2021-03-12 MED ORDER — FENTANYL CITRATE PF 50 MCG/ML IJ SOSY
25.0000 ug | PREFILLED_SYRINGE | Freq: Once | INTRAMUSCULAR | Status: AC
  Administered 2021-03-12: 25 ug via INTRAVENOUS
  Filled 2021-03-12: qty 1

## 2021-03-12 NOTE — ED Notes (Signed)
No acute distress noted upon this RN's departure of patient. Verified discharge paperwork with name and DOB. Vital signs stable. Patient taken to checkout window. Discharge paperwork discussed with patient and husband - Patient states pain is much better. Patient was able to ambulate out without incident or assistance.  No further questions voiced upon discharge.

## 2021-03-12 NOTE — ED Notes (Signed)
Assumed care from [facility]. Patient laying quietly on gurney. No acute distress noted. Patient updated on plan of care. Husband at bedside. Awaiting further orders. Will continue to monitor.

## 2021-03-12 NOTE — ED Triage Notes (Signed)
Pt arrives to ED with generalized lower abdominal pain that started today at 3am. The pain is described as spasm. Pt reports she has associated vomiting and nausea. She has vomited 3-4 times. She reports BRBPR x2 since this morning.

## 2021-03-12 NOTE — Discharge Instructions (Addendum)
Your work-up today revealed infectious colitis.  I have given you 2 antibiotics metronidazole and ciprofloxacin.  Please take for the next 7 days.  Tylenol/ibuprofen for pain control.  I would like for you to follow-up with your primary care doctor within next week to ensure we are going in the right direction.  Please return to the emergency department if you experience worsening pain, fever, trouble breathing, generalized weakness, excessive bloody diarrhea, or any other concerns you might have.

## 2021-03-12 NOTE — ED Provider Notes (Signed)
[facility] EMERGENCY DEPT Provider Note   CSN: [id number] Arrival date & time: 03/12/21  [id number]     History Chief Complaint  Patient presents with   Abdominal Pain    [name] is a 50 y.o. female with history of IBS with more constipation who presents to the emergency department for further evaluation of lower abdominal pain that woke her up from sleep around 3 AM this morning.  She does state that she has had similar episodes in the past with her IBS symptoms however this is more painful.  She typically takes Bentyl for her IBS induced constipation which she has not been taking recently.  She does report associated diarrhea and hematochezia but denies any fever, chills, cough, congestion, urinary complaints, vaginal symptoms, nausea, vomiting.  She describes her abdominal pain as a spasm sensation and she rates her abdominal pain 10/10 in severity.  She is not taken anything for her symptoms.   Abdominal Pain     Past Medical History:  Diagnosis Date   GERD (gastroesophageal reflux disease)    Headache(784.0)    IBS (irritable bowel syndrome)    Seasonal allergies     Patient Active Problem List   Diagnosis Date Noted   Bunion 06/29/2018    Past Surgical History:  Procedure Laterality Date   CESAREAN SECTION     X4   kidney stones     TONSILLECTOMY  08/04/2011   Procedure: TONSILLECTOMY;  Surgeon: Izora Gala, MD;  Location: [facility];  Service: ENT;  Laterality: N/A;   TONSILLECTOMY     TUBAL LIGATION       OB History     Gravida  5   Para  3   Term  2   Preterm  1   AB  1   Living  5      SAB      IAB  1   Ectopic  0   Multiple  1   Live Births  5           Family History  Problem Relation Age of Onset   Diabetes Other    Hypertension Other    Cancer Mother    Hypertension Father    Diabetes Father    Heart failure Father     Social History   Tobacco Use   Smoking status: Never   Smokeless  tobacco: Never  Vaping Use   Vaping Use: Never used  Substance Use Topics   Alcohol use: No   Drug use: No    Home Medications Prior to Admission medications   Medication Sig Start Date End Date Taking? Authorizing Provider  amitriptyline (ELAVIL) 50 MG tablet Take 50 mg by mouth at bedtime.   Yes [provider]  ciprofloxacin (CIPRO) 500 MG tablet Take 1 tablet (500 mg total) by mouth every 12 (twelve) hours. 03/12/21  Yes Raul Del, Tagen Milby M, PA-C  cyclobenzaprine (FLEXERIL) 10 MG tablet Take 1 tablet (10 mg total) by mouth 3 (three) times daily as needed for muscle spasms. 03/19/18  Yes Jola Schmidt, MD  desloratadine (CLARINEX) 5 MG tablet Take 1 tablet by mouth daily. 11/27/20  Yes [provider]  LINZESS 145 MCG CAPS capsule Take 145 mcg by mouth every morning. 06/09/18  Yes [provider]  metroNIDAZOLE (FLAGYL) 500 MG tablet Take 1 tablet (500 mg total) by mouth 2 (two) times daily. 03/12/21  Yes Myna Bright M, PA-C  Multiple Vitamin (MULTIVITAMIN WITH MINERALS) TABS Take  1 tablet by mouth at bedtime.    Yes [provider]  omeprazole (PRILOSEC) 10 MG capsule Take 10 mg by mouth daily.   Yes [provider]  zolpidem (AMBIEN) 10 MG tablet Take 10 mg by mouth at bedtime.    Yes [provider]  albuterol (PROVENTIL HFA;VENTOLIN HFA) 108 (90 Base) MCG/ACT inhaler  03/29/18   [provider]  albuterol (VENTOLIN HFA) 108 (90 Base) MCG/ACT inhaler 1-2 inhalations every 4-6 hours as needed for wheezing. Dispense spacer as needed. 03/29/18 03/29/19  [provider]  azelastine (ASTELIN) 0.1 % nasal spray Place 2 sprays into both nostrils 2 (two) times daily. 01/20/20   Tasia Catchings, Amy V, PA-C  clonazePAM (KLONOPIN) 0.5 MG tablet Take 0.5 mg by mouth daily as needed for anxiety.  03/01/18   [provider]  diclofenac sodium (VOLTAREN) 1 % GEL Apply 2 g topically 4 (four) times daily. Rub into affected area of foot 2  to 4 times daily Patient not taking: No sig reported 06/29/18   Trula Slade, DPM  dicyclomine (BENTYL) 20 MG tablet Take 1 tablet (20 mg total) by mouth 3 (three) times daily before meals. Patient not taking: No sig reported 02/10/18   Shelly Bombard, MD  escitalopram (LEXAPRO) 10 MG tablet Take 10 mg by mouth daily.    [provider]  fluticasone (FLONASE) 50 MCG/ACT nasal spray Place 1-2 sprays into both nostrils daily. 01/02/20   Wieters, Hallie C, PA-C  loratadine (CLARITIN) 10 MG tablet Take 1 tablet (10 mg total) by mouth daily. Patient not taking: No sig reported 08/31/19   Hall-Potvin, [place], PA-C  promethazine (PHENERGAN) 25 MG suppository Place 1 suppository (25 mg total) rectally every 6 (six) hours as needed for nausea. Patient not taking: Reported on 03/17/2018 08/04/11 08/31/19  Izora Gala, MD  ranitidine (ZANTAC) 150 MG tablet Take 150 mg by mouth 2 (two) times daily.  08/31/19  [provider]    Allergies    Hydrocodone-acetaminophen, Vicodin [hydrocodone-acetaminophen], and Penicillins  Review of Systems   Review of Systems  Gastrointestinal:  Positive for abdominal pain.  All other systems reviewed and are negative.  Physical Exam Updated Vital Signs BP 138/75   Pulse 81   Temp 98.6 F (37 C)   Resp 16   Ht 5\' 4"  (1.626 m)   Wt 77.1 kg   LMP 02/14/2018   SpO2 97%   BMI 29.18 kg/m   Physical Exam Vitals and nursing note reviewed.  Constitutional:      General: She is not in acute distress.    Appearance: Normal appearance.  HENT:     Head: Normocephalic and atraumatic.  Eyes:     General:        Right eye: No discharge.        Left eye: No discharge.  Cardiovascular:     Comments: Regular rate and rhythm.  S1/S2 are distinct without any evidence of murmur, rubs, or gallops.  Radial pulses are 2+ bilaterally.  Dorsalis pedis pulses are 2+ bilaterally.  No evidence of pedal edema. Pulmonary:     Comments: Clear to auscultation  bilaterally.  Normal effort.  No respiratory distress.  No evidence of wheezes, rales, or rhonchi heard throughout. Abdominal:     General: Abdomen is flat. Bowel sounds are normal. There is no distension.     Tenderness: There is no guarding or rebound.     Comments: Moderate suprapubic and right lower quadrant abdominal tenderness.  Mild left lower quadrant abdominal tenderness.  No upper abdominal tenderness.  No obvious peritoneal signs at this time.  Musculoskeletal:        General: Normal range of motion.     Cervical back: Neck supple.  Skin:    General: Skin is warm and dry.     Findings: No rash.  Neurological:     General: No focal deficit present.     Mental Status: She is alert.  Psychiatric:        Mood and Affect: Mood normal.        Behavior: Behavior normal.    ED Results / Procedures / Treatments   Labs (all labs ordered are listed, but only abnormal results are displayed) Labs Reviewed  COMPREHENSIVE METABOLIC PANEL - Abnormal; Notable for the following components:      Result Value   CO2 21 (*)    Glucose, Bld 180 (*)    Creatinine, Ser 1.06 (*)    All other components within normal limits  URINALYSIS, ROUTINE W REFLEX MICROSCOPIC - Abnormal; Notable for the following components:   Protein, ur TRACE (*)    All other components within normal limits  OCCULT BLOOD X 1 CARD TO LAB, STOOL - Abnormal; Notable for the following components:   Fecal Occult Bld POSITIVE (*)    All other components within normal limits  RESP PANEL BY RT-PCR (FLU A&B, COVID) ARPGX2  LIPASE, BLOOD  CBC  PREGNANCY, URINE  POC OCCULT BLOOD, ED    EKG None  Radiology CT ABDOMEN PELVIS W CONTRAST  Result Date: 03/12/2021 CLINICAL DATA:  Right lower quadrant abdominal pain starting today at 3 a.m. Nausea and vomiting. Reported bright red blood per rectum. EXAM: CT ABDOMEN AND PELVIS WITH CONTRAST TECHNIQUE: Multidetector CT imaging of the abdomen and pelvis was performed using the  standard protocol following bolus administration of intravenous contrast. CONTRAST:  64mL OMNIPAQUE IOHEXOL 300 MG/ML  SOLN COMPARISON:  11/28/2009 CT scan FINDINGS: Lower chest: Unremarkable Hepatobiliary: Probable hepatic steatosis. Gallbladder unremarkable. No biliary dilatation. Pancreas: Unremarkable Spleen: Unremarkable Adrenals/Urinary Tract: Both adrenal glands appear normal. 0.5 cm hypodense right kidney upper pole lesion on image 24 series 2 is probably a cyst but technically too small to characterize. The kidneys appear otherwise unremarkable. Punctate 2 mm right kidney upper pole nonobstructive renal calculus on image 40 series 5. Urinary bladder unremarkable. Stomach/Bowel: The proximal appendices is borderline capacious, measuring 0.7 cm in diameter, a small amount of gas in this portion of the appendix, and a nondistended distal appendix. No periappendiceal inflammatory findings. The proximal appendix is reduced in prominence compared to the prior exams such as 11/28/2009, and overall the appearance is not considered suspicious for appendicitis at this time. Nondistended sigmoid colon with some faint stranding along its margins and possible wall thickening in the distal sigmoid colon as on images 49 through 63 of series 5. I do not see substantial diverticular disease and the appearance raises suspicion for sigmoid colitis, tending to favor either infectious colitis or inflammatory bowel disease given the lack of substantial vascular findings. No pneumatosis. No surrounding ascites. No abscess identified. Vascular/Lymphatic: Unremarkable Reproductive: There is close approximation of the anterior uterine fundus and body to the right anterior peritoneal margin and right rectus abdominus for example on image 62 series 6, adhesion not excluded. Otherwise unremarkable. Other: No supplemental non-categorized findings. Musculoskeletal: Degenerative disc disease at L4-5 may be contributing to moderate central  narrowing of the thecal sac at this level. IMPRESSION: 1. Abnormal  wall thickening in a segment of the sigmoid colon with surrounding inflammatory stranding. Appearance favors infectious colitis although inflammatory bowel disease is a differential diagnostic consideration. Given the normal appearance of the vascular, ischemic causes are considered unlikely. 2. Probable hepatic steatosis. 3. Close approximation of the anterior uterine fundus with the posterior rectus muscle may be incidental but could be due to an adhesion. 4. 2 mm right kidney upper pole nonobstructive renal calculus. 5. Suspected central narrowing of the thecal sac at L4-5 due to disc bulge. Electronically Signed   By: Gaylyn Rong M.D.   On: 03/12/2021 13:38    Procedures Procedures   Medications Ordered in ED Medications  fentaNYL (SUBLIMAZE) injection 25 mcg (25 mcg Intravenous Given 03/12/21 1326)  iohexol (OMNIPAQUE) 300 MG/ML solution 100 mL (80 mLs Intravenous Contrast Given 03/12/21 1304)    ED Course  I have reviewed the triage vital signs and the nursing notes.  Pertinent labs & imaging results that were available during my care of the patient were reviewed by me and considered in my medical decision making (see chart for details).  Clinical Course as of 03/12/21 1509  Tue Mar 12, 2021  1358 I discussed this case with my attending physician who cosigned this note including patient's presenting symptoms, physical exam, and planned diagnostics and interventions. Attending physician stated agreement with plan or made changes to plan which were implemented.   Attending physician assessed patient at bedside.   [CF]    Clinical Course User Index [CF] Teressa Lower, PA-C   MDM Rules/Calculators/A&P                          [name] is a 50 y.o. female with history of IBS abuse constipation who presents to the emergency department for evaluation of lower abdominal pain.  History physical exam is  concerning for infectious etiology, diverticulitis/diverticulosis, appendicitis, urinary tract infection, cystitis.  Patient not having any vaginal symptoms have low suspicion for PID or TOA at this time.  Given the nature of her symptoms I will evaluate with a CT abdomen pelvis.  The rest of her work-up was ordered in triage.  CBC is unremarkable.  CMP shows mild elevated creatinine which is improved from 10 months ago.  COVID influenza negative.  Pregnancy negative.  CT abdomen pelvis was concerning for infectious colitis.  Occult blood was positive.  No signs of diverticulitis or appendicitis.  I have a low suspicion for pelvic etiology at this time.  Given the clinical scenario, I am going to treat her for bacterial infectious colitis.  She is allergic to Augmentin so I will start her on Flagyl and ciprofloxacin.  Strict return precautions were given.  I will also have her follow-up with her primary care doctor for further evaluation and possible GI follow-up if she does not have a GI doctor.  She expressed full understanding all questions and concerns addressed.  She is safe for discharge.   Final Clinical Impression(s) / ED Diagnoses Final diagnoses:  Colitis    Rx / DC Orders ED Discharge Orders          Ordered    metroNIDAZOLE (FLAGYL) 500 MG tablet  2 times daily        03/12/21 1508    ciprofloxacin (CIPRO) 500 MG tablet  Every 12 hours        03/12/21 1508             Honor Loh  Curt Jews 03/12/21 1510    Tegeler, Gwenyth Allegra, MD 03/12/21 1555

## 2021-04-15 ENCOUNTER — Other Ambulatory Visit: Payer: Self-pay | Admitting: Internal Medicine

## 2021-04-15 ENCOUNTER — Ambulatory Visit
Admission: RE | Admit: 2021-04-15 | Discharge: 2021-04-15 | Disposition: A | Discharge status: Home / Self Care | Payer: BC Managed Care – PPO | Source: Ambulatory Visit | Attending: Internal Medicine | Admitting: Internal Medicine

## 2021-04-15 ENCOUNTER — Other Ambulatory Visit: Payer: Self-pay

## 2021-04-15 NOTE — ED Triage Notes (Signed)
Patient reports having a sore throat and headache since yesterday.

## 2021-04-16 LAB — INFLUENZA A AND B AG, IMMUNOASSAY
INFLUENZA A ANTIGEN: NOT DETECTED
INFLUENZA B ANTIGEN: NOT DETECTED
MICRO NUMBER:: 12746812
SPECIMEN QUALITY:: ADEQUATE

## 2021-04-16 LAB — SARS-COV-2 RNA,(COVID-19) QUALITATIVE NAAT: SARS CoV2 RNA: NOT DETECTED

## 2021-09-06 ENCOUNTER — Other Ambulatory Visit: Payer: Self-pay | Admitting: Internal Medicine

## 2021-09-06 DIAGNOSIS — Z1231 Encounter for screening mammogram for malignant neoplasm of breast: Secondary | ICD-10-CM

## 2021-10-02 ENCOUNTER — Ambulatory Visit
Admission: RE | Admit: 2021-10-02 | Discharge: 2021-10-02 | Disposition: A | Discharge status: Home / Self Care | Payer: BC Managed Care – PPO | Source: Ambulatory Visit | Attending: Internal Medicine | Admitting: Internal Medicine

## 2021-10-02 DIAGNOSIS — Z1231 Encounter for screening mammogram for malignant neoplasm of breast: Secondary | ICD-10-CM

## 2021-10-28 ENCOUNTER — Ambulatory Visit: Payer: BC Managed Care – PPO

## 2021-10-29 ENCOUNTER — Other Ambulatory Visit (HOSPITAL_COMMUNITY)
Admission: RE | Admit: 2021-10-29 | Discharge: 2021-10-29 | Disposition: A | Discharge status: Home / Self Care | Payer: BC Managed Care – PPO | Source: Ambulatory Visit | Attending: Obstetrics and Gynecology | Admitting: Obstetrics and Gynecology

## 2021-10-29 ENCOUNTER — Ambulatory Visit (INDEPENDENT_AMBULATORY_CARE_PROVIDER_SITE_OTHER): Payer: BC Managed Care – PPO

## 2021-10-29 VITALS — BP 136/90 | HR 85 | Ht 65.0 in | Wt 182.0 lb

## 2021-10-29 DIAGNOSIS — N898 Other specified noninflammatory disorders of vagina: Secondary | ICD-10-CM

## 2021-10-29 DIAGNOSIS — B3731 Acute candidiasis of vulva and vagina: Secondary | ICD-10-CM | POA: Insufficient documentation

## 2021-10-30 ENCOUNTER — Telehealth: Payer: Self-pay

## 2021-10-30 LAB — CERVICOVAGINAL ANCILLARY ONLY
Bacterial Vaginitis (gardnerella): NEGATIVE
Candida Glabrata: NEGATIVE
Candida Vaginitis: POSITIVE — AB
Chlamydia: NEGATIVE
Comment: NEGATIVE
Comment: NEGATIVE
Comment: NEGATIVE
Comment: NEGATIVE
Comment: NEGATIVE
Comment: NORMAL
Neisseria Gonorrhea: NEGATIVE
Trichomonas: NEGATIVE

## 2021-10-30 MED ORDER — FLUCONAZOLE 150 MG PO TABS: 150.0000 mg | ORAL_TABLET | Freq: Once | ORAL | 0 refills | Status: AC

## 2021-10-30 NOTE — Progress Notes (Signed)
Patient was assessed and managed by nursing staff during this encounter. I have reviewed the chart and agree with the documentation and plan. I have also made any necessary editorial changes.  Catalina Antigua, MD 10/30/2021 10:25 AM

## 2021-10-30 NOTE — Addendum Note (Signed)
Addended by: Catalina Antigua on: 10/30/2021 04:39 PM   Modules accepted: Orders

## 2021-10-30 NOTE — Telephone Encounter (Signed)
Returned call about results, no answwr, left vm

## 2021-10-31 ENCOUNTER — Ambulatory Visit: Payer: BC Managed Care – PPO | Admitting: Podiatry

## 2021-11-12 ENCOUNTER — Ambulatory Visit (INDEPENDENT_AMBULATORY_CARE_PROVIDER_SITE_OTHER): Payer: Self-pay | Admitting: Podiatry

## 2021-11-12 DIAGNOSIS — Z91199 Patient's noncompliance with other medical treatment and regimen due to unspecified reason: Secondary | ICD-10-CM

## 2021-11-13 ENCOUNTER — Encounter: Payer: Self-pay | Admitting: Podiatry

## 2021-11-13 NOTE — Progress Notes (Signed)
Patient was no-show for appointment today 

## 2021-11-26 ENCOUNTER — Ambulatory Visit (INDEPENDENT_AMBULATORY_CARE_PROVIDER_SITE_OTHER): Payer: BC Managed Care – PPO

## 2021-11-26 ENCOUNTER — Ambulatory Visit (INDEPENDENT_AMBULATORY_CARE_PROVIDER_SITE_OTHER): Payer: BC Managed Care – PPO | Admitting: Podiatry

## 2021-11-26 DIAGNOSIS — M2142 Flat foot [pes planus] (acquired), left foot: Secondary | ICD-10-CM | POA: Diagnosis not present

## 2021-11-26 DIAGNOSIS — M2011 Hallux valgus (acquired), right foot: Secondary | ICD-10-CM | POA: Diagnosis not present

## 2021-11-26 DIAGNOSIS — M21611 Bunion of right foot: Secondary | ICD-10-CM | POA: Diagnosis not present

## 2021-11-26 DIAGNOSIS — M2141 Flat foot [pes planus] (acquired), right foot: Secondary | ICD-10-CM | POA: Diagnosis not present

## 2021-11-26 DIAGNOSIS — M21619 Bunion of unspecified foot: Secondary | ICD-10-CM

## 2021-11-26 NOTE — Progress Notes (Signed)
  Subjective:  Patient ID: [name], female    DOB: 02-Aug-1970,  MRN: [id number]  Chief Complaint  Patient presents with   Bunions    Rt bunion pain, great toe, re-est care, Pain located near the ball of the foot near the great toe.     51 y.o. female presents with the above complaint. History confirmed with patient.  Most of the pain is over the bump over the big toe.  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  Has arch pain as well in her shoes  Objective:  Physical Exam: warm, good capillary refill, no trophic changes or ulcerative lesions, normal DP and PT pulses, normal sensory exam, and bilaterally she has pes planus with mild tenderness in the arch.  On the right side she has mild to moderate hallux valgus with a prominent.   Radiographs: Multiple views x-ray of the right foot: no fracture, dislocation, swelling or degenerative changes noted and she does have hallux valgus with a small bunion, there is primarily hallux interphalangeus deformity, intermetatarsal angle slight deviation, minimal frontal plane rotation Assessment:   1. Bunion      Plan:  Patient was evaluated and treated and all questions answered.  For her pes planus deformity she is on her feet quite a lot and she does have a flatfoot deformity.  I discussed with her supporting with an orthotic insert and she was casted for these today.  I think these will benefit for her long-term with the type of work she does  Discussed the etiology and treatment including surgical and non surgical treatment for painful bunions.  She has exhausted all non surgical treatment prior to this visit including shoe gear changes and padding.  She desires surgical intervention. We discussed all risks including but not limited to: pain, swelling, infection, scar, numbness which may be temporary or permanent, chronic pain, stiffness, nerve pain or damage, wound  healing problems, bone healing problems including delayed or non-union and recurrence. Specifically we discussed the following procedures: Bunionectomy with phalangeal osteotomy, possible double osteotomy and removal of bunion of the right foot. Informed consent was signed today. Surgery will be scheduled at a mutually agreeable date. Information regarding this will be forwarded to our surgery scheduler.   Surgical plan:  Procedure: -Bunionectomy with phalangeal osteotomy, possible double osteotomy and removal of bunion of the right foot  Location: -[facility]  Anesthesia plan: -IV sedation with local  Postoperative pain plan: - Tylenol 1000 mg every 6 hours, ibuprofen 600 mg every 6 hours, gabapentin 300 mg every 8 hours x5 days, oxycodone 5 mg 1-2 tabs every 6 hours only as needed  DVT prophylaxis: -None required  WB Restrictions / DME needs: -WBAT in CAM boot postop  Return for after surgery.

## 2021-11-27 ENCOUNTER — Encounter: Payer: Self-pay | Admitting: Obstetrics and Gynecology

## 2021-11-27 ENCOUNTER — Other Ambulatory Visit (HOSPITAL_COMMUNITY)
Admission: RE | Admit: 2021-11-27 | Discharge: 2021-11-27 | Disposition: A | Discharge status: Home / Self Care | Payer: BC Managed Care – PPO | Source: Ambulatory Visit | Attending: Obstetrics and Gynecology | Admitting: Obstetrics and Gynecology

## 2021-11-27 ENCOUNTER — Ambulatory Visit (INDEPENDENT_AMBULATORY_CARE_PROVIDER_SITE_OTHER): Payer: BC Managed Care – PPO | Admitting: Obstetrics and Gynecology

## 2021-11-27 VITALS — BP 146/94 | HR 82 | Ht 64.0 in | Wt 171.3 lb

## 2021-11-27 DIAGNOSIS — Z113 Encounter for screening for infections with a predominantly sexual mode of transmission: Secondary | ICD-10-CM

## 2021-11-27 DIAGNOSIS — Z01419 Encounter for gynecological examination (general) (routine) without abnormal findings: Secondary | ICD-10-CM | POA: Diagnosis not present

## 2021-11-27 NOTE — Progress Notes (Signed)
GYNECOLOGY ANNUAL PREVENTATIVE CARE ENCOUNTER NOTE  History:     [name] is a 51 y.o. [phone] female here for a routine annual gynecologic exam.  Current complaints: none.   Denies abnormal vaginal bleeding, discharge, pelvic pain, problems with intercourse or other gynecologic concerns.    Gynecologic History Patient's last menstrual period was 02/14/2018. Contraception: post menopausal status Last Pap: 11/01/20. Results were: normal with negative HPV Last mammogram: 10/02/21. Results were: normal  Obstetric History OB History  Gravida Para Term Preterm AB Living  6 3 2 1 2 5   SAB IAB Ectopic Multiple Live Births  1 1 0 1 5    # Outcome Date GA Lbr Len/2nd Weight Sex Delivery Anes PTL Lv  6 Term 10/07/04     CS-LTranv   LIV  5 SAB 2005          4A Gravida 01/03/02     CS-LTranv   LIV  4B Preterm 01/13/02     CS-LTranv   LIV  3 Term 01/09/00     CS-LTranv   LIV  2 Gravida 06/08/91     CS-LTranv   LIV  1 IAB             Past Medical History:  Diagnosis Date   GERD (gastroesophageal reflux disease)    Headache(784.0)    IBS (irritable bowel syndrome)    Seasonal allergies     Past Surgical History:  Procedure Laterality Date   CESAREAN SECTION     X4   kidney stones     TONSILLECTOMY  08/04/2011   Procedure: TONSILLECTOMY;  Surgeon: Izora Gala, MD;  Location: [facility];  Service: ENT;  Laterality: N/A;   TONSILLECTOMY     TUBAL LIGATION      Current Outpatient Medications on File Prior to Visit  Medication Sig Dispense Refill   albuterol (PROVENTIL HFA;VENTOLIN HFA) 108 (90 Base) MCG/ACT inhaler      amitriptyline (ELAVIL) 50 MG tablet Take 50 mg by mouth at bedtime.     azelastine (ASTELIN) 0.1 % nasal spray Place 2 sprays into both nostrils 2 (two) times daily. 30 mL 0   ciprofloxacin (CIPRO) 500 MG tablet Take 1 tablet (500 mg total) by mouth every 12 (twelve) hours. 14 tablet 0   clonazePAM (KLONOPIN) 0.5 MG tablet Take 0.5 mg by mouth  daily as needed for anxiety.   2   cyclobenzaprine (FLEXERIL) 10 MG tablet Take 1 tablet (10 mg total) by mouth 3 (three) times daily as needed for muscle spasms. 12 tablet 0   desloratadine (CLARINEX) 5 MG tablet Take 1 tablet by mouth daily.     dicyclomine (BENTYL) 20 MG tablet Take 1 tablet (20 mg total) by mouth 3 (three) times daily before meals. 90 tablet 5   escitalopram (LEXAPRO) 10 MG tablet Take 10 mg by mouth daily.     fluticasone (FLONASE) 50 MCG/ACT nasal spray Place 1-2 sprays into both nostrils daily. 16 g 0   LINZESS 145 MCG CAPS capsule Take 145 mcg by mouth every morning.     Multiple Vitamin (MULTIVITAMIN WITH MINERALS) TABS Take 1 tablet by mouth at bedtime.      omeprazole (PRILOSEC) 10 MG capsule Take 10 mg by mouth daily.     zolpidem (AMBIEN) 10 MG tablet Take 10 mg by mouth at bedtime.      albuterol (VENTOLIN HFA) 108 (90 Base) MCG/ACT inhaler 1-2 inhalations every 4-6 hours as needed for wheezing. Dispense  spacer as needed.     diclofenac sodium (VOLTAREN) 1 % GEL Apply 2 g topically 4 (four) times daily. Rub into affected area of foot 2 to 4 times daily (Patient not taking: Reported on 11/01/2020) 100 g 2   loratadine (CLARITIN) 10 MG tablet Take 1 tablet (10 mg total) by mouth daily. (Patient not taking: Reported on 03/12/2021) 30 tablet 0   metroNIDAZOLE (FLAGYL) 500 MG tablet Take 1 tablet (500 mg total) by mouth 2 (two) times daily. (Patient not taking: Reported on 10/29/2021) 14 tablet 0   [DISCONTINUED] promethazine (PHENERGAN) 25 MG suppository Place 1 suppository (25 mg total) rectally every 6 (six) hours as needed for nausea. (Patient not taking: Reported on 03/17/2018) 12 each 0   [DISCONTINUED] ranitidine (ZANTAC) 150 MG tablet Take 150 mg by mouth 2 (two) times daily.     No current facility-administered medications on file prior to visit.    Allergies  Allergen Reactions   Hydrocodone-Acetaminophen Itching   Vicodin [Hydrocodone-Acetaminophen] Itching    Penicillins Rash    Has patient had a PCN reaction causing immediate rash, facial/tongue/throat swelling, SOB or lightheadedness with hypotension: Yes Has patient had a PCN reaction causing severe rash involving mucus membranes or skin necrosis: No Has patient had a PCN reaction that required hospitalization: No Has patient had a PCN reaction occurring within the last 10 years: No If all of the above answers are "NO", then may proceed with Cephalosporin use.     Social History:  reports that she has never smoked. She has never used smokeless tobacco. She reports that she does not drink alcohol and does not use drugs.  Family History  Problem Relation Age of Onset   Diabetes Other    Hypertension Other    Cancer Mother    Hypertension Father    Diabetes Father    Heart failure Father     The following portions of the patient's history were reviewed and updated as appropriate: allergies, current medications, past family history, past medical history, past social history, past surgical history and problem list.  Review of Systems Pertinent items noted in HPI and remainder of comprehensive ROS otherwise negative.  Physical Exam:  BP (!) 146/94   Pulse 82   Ht 5\' 4"  (1.626 m)   Wt 171 lb 4.8 oz (77.7 kg)   LMP 02/14/2018   BMI 29.40 kg/m  CONSTITUTIONAL: Well-developed, well-nourished female in no acute distress.  HENT:  Normocephalic, atraumatic, External right and left ear normal. Oropharynx is clear and moist EYES: Conjunctivae and EOM are normal.  NECK: Normal range of motion, supple, no masses.  Normal thyroid.  SKIN: Skin is warm and dry. No rash noted. Not diaphoretic. No erythema. No pallor. MUSCULOSKELETAL: Normal range of motion. No tenderness.  No cyanosis, clubbing, or edema.  2+ distal pulses. NEUROLOGIC: Alert and oriented to person, place, and time. Normal reflexes, muscle tone coordination.  PSYCHIATRIC: Normal mood and affect. Normal behavior. Normal judgment and  thought content. CARDIOVASCULAR: Normal heart rate noted, regular rhythm RESPIRATORY: Clear to auscultation bilaterally. Effort and breath sounds normal, no problems with respiration noted. BREASTS: Symmetric in size. No masses, tenderness, skin changes, nipple drainage, or lymphadenopathy bilaterally. Performed in the presence of a chaperone. ABDOMEN: Soft, no distention noted.  No tenderness, rebound or guarding.  PELVIC: Normal appearing external genitalia and urethral meatus; normal appearing vaginal mucosa and cervix.  No abnormal discharge noted.  Pap smear obtained.  Normal uterine size, no other palpable masses, no uterine  or adnexal tenderness. Cervix is high in the pelvis and there does appear to be som tethering of the uterus to the abdominal wall. Performed in the presence of a chaperone.   Assessment and Plan:    1. Routine screening for STI (sexually transmitted infection) Per pt request  - HepB+HepC+HIV Panel - [facility] - Cervicovaginal ancillary only( [address])  2. Women's annual routine gynecological examination Normal annual exam  Will follow up results of pap smear and manage accordingly. Mammogram reviewed and was normal. Routine preventative health maintenance measures emphasized. Please refer to After Visit Summary for other counseling recommendations.      Mariel Aloe, MD, FACOG Obstetrician & Gynecologist, [facility], [facility]

## 2021-11-27 NOTE — Progress Notes (Signed)
Pt presents for annual exam. Pt requesting STD testing and TOC for yeast infection. No other concerns at this time.  Last PAP: 11/01/2020 Last mammogram: 10/03/2021

## 2021-11-28 DIAGNOSIS — M79676 Pain in unspecified toe(s): Secondary | ICD-10-CM

## 2021-11-28 LAB — RPR: RPR Ser Ql: NONREACTIVE

## 2021-11-28 LAB — CERVICOVAGINAL ANCILLARY ONLY
Bacterial Vaginitis (gardnerella): NEGATIVE
Candida Glabrata: NEGATIVE
Candida Vaginitis: NEGATIVE
Chlamydia: NEGATIVE
Comment: NEGATIVE
Comment: NEGATIVE
Comment: NEGATIVE
Comment: NEGATIVE
Comment: NEGATIVE
Comment: NORMAL
Neisseria Gonorrhea: NEGATIVE
Trichomonas: NEGATIVE

## 2021-11-28 LAB — HEPB+HEPC+HIV PANEL
HIV Screen 4th Generation wRfx: NONREACTIVE
Hep B C IgM: NEGATIVE
Hep B Core Total Ab: NEGATIVE
Hep B E Ab: NEGATIVE
Hep B E Ag: NEGATIVE
Hep B Surface Ab, Qual: NONREACTIVE
Hep C Virus Ab: NONREACTIVE
Hepatitis B Surface Ag: NEGATIVE

## 2021-11-29 ENCOUNTER — Other Ambulatory Visit (HOSPITAL_COMMUNITY)
Admission: RE | Admit: 2021-11-29 | Discharge: 2021-11-29 | Disposition: A | Discharge status: Home / Self Care | Payer: BC Managed Care – PPO | Source: Ambulatory Visit | Attending: Obstetrics and Gynecology | Admitting: Obstetrics and Gynecology

## 2021-11-29 DIAGNOSIS — Z01419 Encounter for gynecological examination (general) (routine) without abnormal findings: Secondary | ICD-10-CM | POA: Diagnosis present

## 2021-11-29 NOTE — Addendum Note (Signed)
Addended by: Maretta Bees on: 11/29/2021 11:52 AM   Modules accepted: Orders

## 2021-12-03 LAB — CYTOLOGY - PAP
Adequacy: ABSENT
Comment: NEGATIVE
Diagnosis: NEGATIVE
High risk HPV: NEGATIVE

## 2021-12-17 ENCOUNTER — Telehealth: Payer: Self-pay | Admitting: Podiatry

## 2021-12-17 NOTE — Telephone Encounter (Signed)
L/V FOR OPU 

## 2021-12-18 ENCOUNTER — Ambulatory Visit (INDEPENDENT_AMBULATORY_CARE_PROVIDER_SITE_OTHER): Payer: BC Managed Care – PPO

## 2021-12-18 DIAGNOSIS — M2142 Flat foot [pes planus] (acquired), left foot: Secondary | ICD-10-CM

## 2021-12-18 DIAGNOSIS — M2141 Flat foot [pes planus] (acquired), right foot: Secondary | ICD-10-CM

## 2021-12-18 NOTE — Progress Notes (Signed)
Patient presents today to pick up custom molded foot orthotics, diagnosed with pes planus by Dr. Lilian Kapur.   Orthotics were dispensed and fit was satisfactory. Reviewed instructions for break-in and wear. Written instructions given to patient.  Patient will follow up as needed.   [facility] - order # [id number]

## 2022-02-06 ENCOUNTER — Telehealth: Payer: Self-pay

## 2022-02-06 NOTE — Telephone Encounter (Signed)
[name] called to cancel her surgery with Dr. Sherryle Lis on 03/21/2022. She stated she doesn't have enough time to cover her while out of work. She will call back to reschedule. Notified Dr. Sherryle Lis and Caren Griffins with [facility]

## 2022-02-28 ENCOUNTER — Ambulatory Visit
Admission: RE | Admit: 2022-02-28 | Discharge: 2022-02-28 | Disposition: A | Discharge status: Home / Self Care | Payer: BC Managed Care – PPO | Source: Ambulatory Visit | Attending: Allergy and Immunology | Admitting: Allergy and Immunology

## 2022-02-28 ENCOUNTER — Other Ambulatory Visit: Payer: Self-pay | Admitting: Allergy and Immunology

## 2022-02-28 DIAGNOSIS — J3081 Allergic rhinitis due to animal (cat) (dog) hair and dander: Secondary | ICD-10-CM

## 2022-04-01 ENCOUNTER — Encounter: Payer: BC Managed Care – PPO | Admitting: Podiatry

## 2022-04-15 ENCOUNTER — Encounter: Payer: BC Managed Care – PPO | Admitting: Podiatry

## 2022-05-06 ENCOUNTER — Encounter: Payer: BC Managed Care – PPO | Admitting: Podiatry

## 2022-07-28 ENCOUNTER — Ambulatory Visit: Payer: BC Managed Care – PPO

## 2022-07-29 ENCOUNTER — Ambulatory Visit (INDEPENDENT_AMBULATORY_CARE_PROVIDER_SITE_OTHER): Payer: BC Managed Care – PPO

## 2022-07-29 ENCOUNTER — Other Ambulatory Visit (HOSPITAL_COMMUNITY)
Admission: RE | Admit: 2022-07-29 | Discharge: 2022-07-29 | Disposition: A | Discharge status: Home / Self Care | Payer: BC Managed Care – PPO | Source: Ambulatory Visit | Attending: Obstetrics and Gynecology | Admitting: Obstetrics and Gynecology

## 2022-07-29 DIAGNOSIS — N898 Other specified noninflammatory disorders of vagina: Secondary | ICD-10-CM

## 2022-07-29 DIAGNOSIS — R8290 Unspecified abnormal findings in urine: Secondary | ICD-10-CM

## 2022-07-29 LAB — POCT URINALYSIS DIPSTICK
Bilirubin, UA: NEGATIVE
Blood, UA: NEGATIVE
Glucose, UA: NEGATIVE
Ketones, UA: NEGATIVE
Leukocytes, UA: NEGATIVE
Nitrite, UA: NEGATIVE
Protein, UA: NEGATIVE
Spec Grav, UA: 1.02 (ref 1.010–1.025)
Urobilinogen, UA: 0.2 E.U./dL
pH, UA: 6.5 (ref 5.0–8.0)

## 2022-07-29 MED ORDER — FLUCONAZOLE 150 MG PO TABS: 150.0000 mg | ORAL_TABLET | Freq: Once | ORAL | 0 refills | Status: AC

## 2022-07-29 NOTE — Progress Notes (Signed)
SUBJECTIVE:  52 y.o. female complains of white and thick vaginal discharge for x 1 week(s). Denies abnormal vaginal bleeding or significant pelvic pain or fever. Also complains of malodorous urine, but denies having any other uti sx. Urine culture sent. Denies history of known exposure to STD.  Patient's last menstrual period was 02/14/2018.  OBJECTIVE:  She appears well, afebrile. Urine dipstick: negative for all components.  ASSESSMENT:  Vaginal Discharge  Vaginal Odor   PLAN:  GC, chlamydia, trichomonas, BVAG, CVAG probe sent to lab. Treatment: To be determined once lab results are received ROV prn if symptoms persist or worsen.

## 2022-07-30 ENCOUNTER — Ambulatory Visit: Payer: BC Managed Care – PPO

## 2022-07-30 LAB — CERVICOVAGINAL ANCILLARY ONLY
Bacterial Vaginitis (gardnerella): NEGATIVE
Candida Glabrata: NEGATIVE
Candida Vaginitis: NEGATIVE
Comment: NEGATIVE
Comment: NEGATIVE
Comment: NEGATIVE

## 2022-07-31 LAB — URINE CULTURE

## 2022-08-16 ENCOUNTER — Emergency Department (HOSPITAL_BASED_OUTPATIENT_CLINIC_OR_DEPARTMENT_OTHER): Payer: BC Managed Care – PPO

## 2022-08-16 ENCOUNTER — Emergency Department (HOSPITAL_COMMUNITY): Payer: BC Managed Care – PPO

## 2022-08-16 ENCOUNTER — Encounter (HOSPITAL_COMMUNITY): Payer: Self-pay

## 2022-08-16 ENCOUNTER — Emergency Department (HOSPITAL_COMMUNITY)
Admission: EM | Admit: 2022-08-16 | Discharge: 2022-08-16 | Disposition: A | Discharge status: Home / Self Care | Payer: BC Managed Care – PPO | Attending: Emergency Medicine | Admitting: Emergency Medicine

## 2022-08-16 DIAGNOSIS — R55 Syncope and collapse: Secondary | ICD-10-CM | POA: Insufficient documentation

## 2022-08-16 DIAGNOSIS — M79662 Pain in left lower leg: Secondary | ICD-10-CM

## 2022-08-16 DIAGNOSIS — M25552 Pain in left hip: Secondary | ICD-10-CM | POA: Diagnosis not present

## 2022-08-16 LAB — URINALYSIS, ROUTINE W REFLEX MICROSCOPIC
Bilirubin Urine: NEGATIVE
Glucose, UA: NEGATIVE mg/dL
Hgb urine dipstick: NEGATIVE
Ketones, ur: NEGATIVE mg/dL
Leukocytes,Ua: NEGATIVE
Nitrite: NEGATIVE
Protein, ur: NEGATIVE mg/dL
Specific Gravity, Urine: 1.02 (ref 1.005–1.030)
pH: 5 (ref 5.0–8.0)

## 2022-08-16 LAB — BASIC METABOLIC PANEL
Anion gap: 5 (ref 5–15)
BUN: 14 mg/dL (ref 6–20)
CO2: 22 mmol/L (ref 22–32)
Calcium: 9.8 mg/dL (ref 8.9–10.3)
Chloride: 109 mmol/L (ref 98–111)
Creatinine, Ser: 1.28 mg/dL — ABNORMAL HIGH (ref 0.44–1.00)
GFR, Estimated: 51 mL/min — ABNORMAL LOW (ref >60)
Glucose, Bld: 128 mg/dL — ABNORMAL HIGH (ref 70–99)
Potassium: 3.9 mmol/L (ref 3.5–5.1)
Sodium: 136 mmol/L (ref 135–145)

## 2022-08-16 LAB — CBC
HCT: 38.8 % (ref 36.0–46.0)
Hemoglobin: 13.2 g/dL (ref 12.0–15.0)
MCH: 31.1 pg (ref 26.0–34.0)
MCHC: 34 g/dL (ref 30.0–36.0)
MCV: 91.5 fL (ref 80.0–100.0)
Platelets: 338 10*3/uL (ref 150–400)
RBC: 4.24 MIL/uL (ref 3.87–5.11)
RDW: 12.7 % (ref 11.5–15.5)
WBC: 6.1 10*3/uL (ref 4.0–10.5)
nRBC: 0 % (ref 0.0–0.2)

## 2022-08-16 LAB — CBG MONITORING, ED: Glucose-Capillary: 127 mg/dL — ABNORMAL HIGH (ref 70–99)

## 2022-08-16 MED ORDER — GABAPENTIN 300 MG PO CAPS
300.0000 mg | ORAL_CAPSULE | Freq: Once | ORAL | Status: AC
  Administered 2022-08-16: 300 mg via ORAL
  Filled 2022-08-16: qty 1

## 2022-08-16 MED ORDER — SODIUM CHLORIDE 0.9 % IV BOLUS
1000.0000 mL | Freq: Once | INTRAVENOUS | Status: AC
  Administered 2022-08-16: 1000 mL via INTRAVENOUS

## 2022-08-16 MED ORDER — DEXAMETHASONE SODIUM PHOSPHATE 4 MG/ML IJ SOLN
4.0000 mg | Freq: Once | INTRAMUSCULAR | Status: AC
  Administered 2022-08-16: 4 mg via INTRAVENOUS
  Filled 2022-08-16: qty 1

## 2022-08-16 MED ORDER — METHYLPREDNISOLONE 4 MG PO TBPK: ORAL_TABLET | ORAL | 0 refills | Status: AC

## 2022-08-16 MED ORDER — KETOROLAC TROMETHAMINE 30 MG/ML IJ SOLN
30.0000 mg | Freq: Once | INTRAMUSCULAR | Status: AC
  Administered 2022-08-16: 30 mg via INTRAVENOUS
  Filled 2022-08-16: qty 1

## 2022-08-16 MED ORDER — GABAPENTIN 300 MG PO CAPS: 300.0000 mg | ORAL_CAPSULE | Freq: Three times a day (TID) | ORAL | 0 refills | Status: DC | PRN

## 2022-08-16 MED ORDER — IBUPROFEN 600 MG PO TABS: 600.0000 mg | ORAL_TABLET | Freq: Three times a day (TID) | ORAL | 0 refills | Status: DC | PRN

## 2022-08-16 NOTE — ED Notes (Signed)
Patient was able to ambulate with a limp she denies being dizzy and states pain is 8/10, MD aware

## 2022-08-16 NOTE — ED Triage Notes (Signed)
Pt. BIB [facility] for multiple syncopal episodes for an hr. Pt. Has had falls. Has not hit her head. But does not remember what happened. Pt. Has had diarrhea. And has been taking antibiotics for about 2 week for her IBS.C/o R. Hip pain. Given fent. IV and NS

## 2022-08-16 NOTE — ED Notes (Signed)
Light green and lavender sent up to lab. Blood glucose and EKG completed.

## 2022-08-16 NOTE — ED Provider Notes (Signed)
[facility] Provider Note   CSN: [id number] Arrival date & time: 08/16/22  4098     History  Chief Complaint  Patient presents with   Loss of Consciousness    [name] is a 52 y.o. female presented to ED complaining of left hip pain and loss of consciousness.  Patient reports that she woke up in middle the night and was having significant throbbing pain in the lateral aspect of her left hip.  He says she was going downstairs for medications and then became lightheaded and may have briefly lost consciousness.  She denies striking her head.  She did feel that she fell and reinjured her left hip.  She is having difficulty bearing weight on the hip now.  Her pain is worse with leg movement.  She denies chest pain or loss of consciousness.  She does report that she has recently recovered from flareup of her "IBS" for which she was on antibiotics, and reports that he was having frequent loose stools over the past several days which have slowly resolved.  She denies any prior history of syncope or known cardiac disease.  HPI     Home Medications Prior to Admission medications   Medication Sig Start Date End Date Taking? Authorizing Provider  gabapentin (NEURONTIN) 300 MG capsule Take 1 capsule (300 mg total) by mouth 3 (three) times daily as needed for up to 21 doses. 08/16/22  Yes Daenerys Buttram, Kermit Balo, MD  ibuprofen (ADVIL) 600 MG tablet Take 1 tablet (600 mg total) by mouth every 8 (eight) hours as needed for up to 21 doses for moderate pain. 08/16/22  Yes Terald Sleeper, MD  methylPREDNISolone (MEDROL DOSEPAK) 4 MG TBPK tablet Take as directed on package 08/17/22  Yes Sesilia Poucher, Kermit Balo, MD  albuterol (PROVENTIL HFA;VENTOLIN HFA) 108 (90 Base) MCG/ACT inhaler  03/29/18   [provider]  albuterol (VENTOLIN HFA) 108 (90 Base) MCG/ACT inhaler 1-2 inhalations every 4-6 hours as needed for wheezing. Dispense spacer as needed. 03/29/18  03/29/19  [provider]  amitriptyline (ELAVIL) 50 MG tablet Take 50 mg by mouth at bedtime.    [provider]  azelastine (ASTELIN) 0.1 % nasal spray Place 2 sprays into both nostrils 2 (two) times daily. 01/20/20   Cathie Hoops, Amy V, PA-C  ciprofloxacin (CIPRO) 500 MG tablet Take 1 tablet (500 mg total) by mouth every 12 (twelve) hours. 03/12/21   Honor Loh M, PA-C  clonazePAM (KLONOPIN) 0.5 MG tablet Take 0.5 mg by mouth daily as needed for anxiety.  03/01/18   [provider]  cyclobenzaprine (FLEXERIL) 10 MG tablet Take 1 tablet (10 mg total) by mouth 3 (three) times daily as needed for muscle spasms. 03/19/18   Azalia Bilis, MD  desloratadine (CLARINEX) 5 MG tablet Take 1 tablet by mouth daily. 11/27/20   [provider]  diclofenac sodium (VOLTAREN) 1 % GEL Apply 2 g topically 4 (four) times daily. Rub into affected area of foot 2 to 4 times daily Patient not taking: Reported on 11/01/2020 06/29/18   Vivi Barrack, DPM  dicyclomine (BENTYL) 20 MG tablet Take 1 tablet (20 mg total) by mouth 3 (three) times daily before meals. 02/10/18   Brock Bad, MD  escitalopram (LEXAPRO) 10 MG tablet Take 10 mg by mouth daily.    [provider]  fluticasone (FLONASE) 50 MCG/ACT nasal spray Place 1-2 sprays into both nostrils daily. 01/02/20   Wieters, Junius Creamer, PA-C  LINZESS 145 MCG CAPS capsule Take 145 mcg by mouth every morning. 06/09/18   [provider]  loratadine (CLARITIN) 10 MG tablet Take 1 tablet (10 mg total) by mouth daily. Patient not taking: Reported on 03/12/2021 08/31/19   Hall-Potvin, [place], PA-C  metroNIDAZOLE (FLAGYL) 500 MG tablet Take 1 tablet (500 mg total) by mouth 2 (two) times daily. 03/12/21   Teressa Lower, PA-C  Multiple Vitamin (MULTIVITAMIN WITH MINERALS) TABS Take 1 tablet by mouth at bedtime.     [provider]  omeprazole (PRILOSEC) 10 MG capsule Take 10 mg by mouth daily.    [provider]   zolpidem (AMBIEN) 10 MG tablet Take 10 mg by mouth at bedtime.     [provider]  promethazine (PHENERGAN) 25 MG suppository Place 1 suppository (25 mg total) rectally every 6 (six) hours as needed for nausea. Patient not taking: Reported on 03/17/2018 08/04/11 08/31/19  Serena Colonel, MD  ranitidine (ZANTAC) 150 MG tablet Take 150 mg by mouth 2 (two) times daily.  08/31/19  [provider]      Allergies    Hydrocodone-acetaminophen, Vicodin [hydrocodone-acetaminophen], and Penicillins    Review of Systems   Review of Systems  Physical Exam Updated Vital Signs BP 120/86 (BP Location: Right Arm)   Pulse 74   Temp 98 F (36.7 C) (Oral)   Resp (!) 23   Ht 5\' 4"  (1.626 m)   Wt 76.7 kg   LMP 02/14/2018   SpO2 99%   BMI 29.01 kg/m  Physical Exam Constitutional:      General: She is not in acute distress. HENT:     Head: Normocephalic and atraumatic.  Eyes:     Conjunctiva/sclera: Conjunctivae normal.     Pupils: Pupils are equal, round, and reactive to light.  Cardiovascular:     Rate and Rhythm: Normal rate and regular rhythm.  Pulmonary:     Effort: Pulmonary effort is normal. No respiratory distress.  Abdominal:     General: There is no distension.     Tenderness: There is no abdominal tenderness.  Musculoskeletal:     Comments: Significant tenderness with palpation of the lateral aspect of the left hip, pain with range of motion testing of the left hip, no visible deformity or shortening or eversion of the lower legs, no lumbar midline tenderness  Skin:    General: Skin is warm and dry.  Neurological:     General: No focal deficit present.     Mental Status: She is alert. Mental status is at baseline.  Psychiatric:        Mood and Affect: Mood normal.        Behavior: Behavior normal.     ED Results / Procedures / Treatments   Labs (all labs ordered are listed, but only abnormal results are displayed) Labs Reviewed  BASIC METABOLIC PANEL -  Abnormal; Notable for the following components:      Result Value   Glucose, Bld 128 (*)    Creatinine, Ser 1.28 (*)    GFR, Estimated 51 (*)    All other components within normal limits  URINALYSIS, ROUTINE W REFLEX MICROSCOPIC - Abnormal; Notable for the following components:   APPearance HAZY (*)    All other components within normal limits  CBG MONITORING, ED - Abnormal; Notable for the following components:   Glucose-Capillary 127 (*)    All other components within normal limits  CBC    EKG EKG Interpretation  Date/Time:  Saturday August 16 2022 06:49:33 EDT Ventricular Rate:  68 PR Interval:  151 QRS Duration: 93 QT Interval:  426 QTC Calculation: 454 R Axis:   32 Text Interpretation: Sinus rhythm Confirmed by Alvester Chou [id number]) on 08/16/2022 7:50:58 AM  Radiology VAS [place] LOWER EXTREMITY VENOUS (DVT) (7a-7p)  Result Date: 08/16/2022  Lower Venous DVT Study Patient Name:  [name]  Date of Exam:   08/16/2022 Medical Rec #: [id number]        Accession #:    [id number] Date of Birth: November 18, 1970       Patient Gender: F Patient Age:   80 years Exam Location:  [facility] Procedure:      VAS [place] LOWER EXTREMITY VENOUS (DVT) Referring Phys: Eligha Kmetz --------------------------------------------------------------------------------  Indications: Left hip pain for 1 day.  Comparison Study: No previous study. Performing Technologist: McKayla Maag RVT, [place]  Examination Guidelines: A complete evaluation includes B-mode imaging, spectral Doppler, color Doppler, and power Doppler as needed of all accessible portions of each vessel. Bilateral testing is considered an integral part of a complete examination. Limited examinations for reoccurring indications may be performed as noted. The reflux portion of the exam is performed with the patient in reverse Trendelenburg.  +-----+---------------+---------+-----------+----------+--------------+  RIGHTCompressibilityPhasicitySpontaneityPropertiesThrombus Aging +-----+---------------+---------+-----------+----------+--------------+ CFV  Full           Yes      Yes                                 +-----+---------------+---------+-----------+----------+--------------+ SFJ  Full                                                        +-----+---------------+---------+-----------+----------+--------------+   +---------+---------------+---------+-----------+----------+--------------+ LEFT     CompressibilityPhasicitySpontaneityPropertiesThrombus Aging +---------+---------------+---------+-----------+----------+--------------+ CFV      Full           Yes      Yes                                 +---------+---------------+---------+-----------+----------+--------------+ SFJ      Full                                                        +---------+---------------+---------+-----------+----------+--------------+ FV Prox  Full                                                        +---------+---------------+---------+-----------+----------+--------------+ FV Mid   Full                                                        +---------+---------------+---------+-----------+----------+--------------+ FV DistalFull                                                        +---------+---------------+---------+-----------+----------+--------------+  PFV      Full                                                        +---------+---------------+---------+-----------+----------+--------------+ POP      Full           Yes      Yes                                 +---------+---------------+---------+-----------+----------+--------------+ PTV      Full                                                        +---------+---------------+---------+-----------+----------+--------------+ PERO     Full                                                         +---------+---------------+---------+-----------+----------+--------------+     Summary: RIGHT: - No evidence of common femoral vein obstruction.  LEFT: - There is no evidence of deep vein thrombosis in the lower extremity.  - No cystic structure found in the popliteal fossa.  *See table(s) above for measurements and observations.    Preliminary    DG Chest 2 View  Result Date: 08/16/2022 CLINICAL DATA:  syncope evaluation EXAM: CHEST - 2 VIEW COMPARISON:  02/28/2022 FINDINGS: Cardiac silhouette is unremarkable. No pneumothorax or pleural effusion. The lungs are clear. The visualized skeletal structures are unremarkable. IMPRESSION: No acute cardiopulmonary process. Electronically Signed   By: Layla Maw M.D.   On: 08/16/2022 09:29   DG Hip Unilat W or Wo Pelvis 2-3 Views Left  Result Date: 08/16/2022 CLINICAL DATA:  Pain EXAM: DG HIP (WITH OR WITHOUT PELVIS) 3V LEFT COMPARISON:  03/17/2018 FINDINGS: There is no evidence of hip fracture or dislocation. There is no evidence of arthropathy or other focal bone abnormality. IMPRESSION: Negative. Electronically Signed   By: Layla Maw M.D.   On: 08/16/2022 09:05    Procedures Procedures    Medications Ordered in ED Medications  ketorolac (TORADOL) 30 MG/ML injection 30 mg (30 mg Intravenous Given 08/16/22 0831)  sodium chloride 0.9 % bolus 1,000 mL (0 mLs Intravenous Stopped 08/16/22 1120)  dexamethasone (DECADRON) injection 4 mg (4 mg Intravenous Given 08/16/22 1309)  gabapentin (NEURONTIN) capsule 300 mg (300 mg Oral Given 08/16/22 1307)    ED Course/ Medical Decision Making/ A&P Clinical Course as of 08/16/22 1442  Sat Aug 16, 2022  1241 Patient reassessed and had some improvement of her pain but is still present in her hip, although she is able to ambulate with assistance.  We are awaiting vascular ultrasound.  In the meantime I do think this is most likely a peripheral neuropathy or nerve issue, and I have ordered Decadron as  well as gabapentin medication. [MT]    Clinical Course User Index [MT] Salvatore Poe, Kermit Balo, MD  Medical Decision Making Amount and/or Complexity of Data Reviewed Labs: ordered. Radiology: ordered.  Risk Prescription drug management.   This patient presents to the ED with concern for syncope versus near syncope and left hip pain.. This involves an extensive number of treatment options, and is a complaint that carries with it a high risk of complications and morbidity.  The differential diagnosis includes radiculopathy versus joint swelling versus DVT versus fracture versus other regarding left hip pain.  It is not clear whether she truly had syncope, which may have lasted a second or 2, more likely near syncope in the setting of significant hip pain. There may also be a component of dehydration with her frequent and recent diarrhea, and also potential vasovagal mechanism for her syncope.  I do not see evidence at this time of large pulmonary embolism as a cause of syncope, including tachycardia or hypoxia.  She has no chest pain or EKG changes to suggest that this is an acute coronary syndrome.  She also takes several sedative medications at night, although she says she has been on these medicines for a long time and does not feel they make her drowsy.  I ordered and personally interpreted labs.  The pertinent results include: No emergent findings on the patient's testing  I ordered imaging studies including x-ray of the chest for syncope, x-ray of the left hip for hip pain, vascular ultrasound of the left lower extremity I independently visualized and interpreted imaging which showed no emergent findings, specifically no DVT or evidence of fracture I agree with the radiologist interpretation  The patient was maintained on a cardiac monitor.  I personally viewed and interpreted the cardiac monitored which showed an underlying rhythm of: Sinus rhythm  Per my  interpretation the patient's ECG shows sinus rhythm no acute ischemic findings  I ordered medication including IV Toradol for pain, IV fluids for hydration  I have reviewed the patients home medicines and have made adjustments as needed   After the interventions noted above, I reevaluated the patient and found that they have: improved  Dispostion:  After consideration of the diagnostic results and the patients response to treatment, I feel that the patent would benefit from close outpatient follow-up.  At this time the clinical suspicion for hip fracture is relatively low given that her pain began before any type of falls or mechanical injury to her hip.  I suspect this most likely peripheral neuropathy or meralgia paresthetica, and we can try steroids, gabapentin and NSAIDs at home, as well as a walker which she has available at home for some extra stability for the next week.  She verbalized understanding she is in agreement the plan.  I have a lower suspicion for a surgical fracture of the hip to warrant MRI imaging of the hip at this time.  Low suspicion for infection.  Likewise I suspect her near syncopal episode may have been vasovagal in nature due to pain, her vital signs have been normal throughout her stay in the ED and no evidence of arrhythmia.  Low suspicion for PE.         Final Clinical Impression(s) / ED Diagnoses Final diagnoses:  Left hip pain  Near syncope    Rx / DC Orders ED Discharge Orders          Ordered    methylPREDNISolone (MEDROL DOSEPAK) 4 MG TBPK tablet        08/16/22 1440    ibuprofen (ADVIL) 600 MG tablet  Every 8  hours PRN        08/16/22 1440    gabapentin (NEURONTIN) 300 MG capsule  3 times daily PRN        08/16/22 1440              Terald Sleeper, MD 08/16/22 1442

## 2022-08-16 NOTE — Progress Notes (Signed)
Left lower extremity venous study completed.   Preliminary results relayed to MD and RN.  Please see CV Procedures for preliminary results.  Kessler Kopinski, RVT  1:55 PM 08/16/22

## 2022-09-02 ENCOUNTER — Other Ambulatory Visit: Payer: Self-pay | Admitting: Internal Medicine

## 2022-09-02 DIAGNOSIS — Z0000 Encounter for general adult medical examination without abnormal findings: Secondary | ICD-10-CM

## 2022-10-08 ENCOUNTER — Ambulatory Visit
Admission: RE | Admit: 2022-10-08 | Discharge: 2022-10-08 | Disposition: A | Discharge status: Home / Self Care | Payer: BC Managed Care – PPO | Source: Ambulatory Visit | Attending: Internal Medicine | Admitting: Internal Medicine

## 2022-10-08 DIAGNOSIS — Z0000 Encounter for general adult medical examination without abnormal findings: Secondary | ICD-10-CM

## 2022-10-20 ENCOUNTER — Other Ambulatory Visit: Payer: Self-pay | Admitting: Internal Medicine

## 2022-10-21 LAB — CBC
HCT: 42 % (ref 35.0–45.0)
Hemoglobin: 14.4 g/dL (ref 11.7–15.5)
MCH: 31.2 pg (ref 27.0–33.0)
MCHC: 34.3 g/dL (ref 32.0–36.0)
MCV: 90.9 fL (ref 80.0–100.0)
MPV: 10.4 fL (ref 7.5–12.5)
Platelets: 346 10*3/uL (ref 140–400)
RBC: 4.62 10*6/uL (ref 3.80–5.10)
RDW: 12.8 % (ref 11.0–15.0)
WBC: 5.1 10*3/uL (ref 3.8–10.8)

## 2022-10-21 LAB — COMPLETE METABOLIC PANEL WITH GFR
AG Ratio: 1.7 (calc) (ref 1.0–2.5)
ALT: 32 U/L — ABNORMAL HIGH (ref 6–29)
AST: 25 U/L (ref 10–35)
Albumin: 4.7 g/dL (ref 3.6–5.1)
Alkaline phosphatase (APISO): 104 U/L (ref 37–153)
BUN/Creatinine Ratio: 10 (calc) (ref 6–22)
BUN: 13 mg/dL (ref 7–25)
CO2: 24 mmol/L (ref 20–32)
Calcium: 10.7 mg/dL — ABNORMAL HIGH (ref 8.6–10.4)
Chloride: 104 mmol/L (ref 98–110)
Creat: 1.26 mg/dL — ABNORMAL HIGH (ref 0.50–1.03)
Globulin: 2.7 g/dL (calc) (ref 1.9–3.7)
Glucose, Bld: 138 mg/dL — ABNORMAL HIGH (ref 65–99)
Potassium: 4.2 mmol/L (ref 3.5–5.3)
Sodium: 138 mmol/L (ref 135–146)
Total Bilirubin: 0.5 mg/dL (ref 0.2–1.2)
Total Protein: 7.4 g/dL (ref 6.1–8.1)
eGFR: 52 mL/min/{1.73_m2} — ABNORMAL LOW (ref >=60)

## 2022-10-21 LAB — LIPID PANEL
Cholesterol: 208 mg/dL — ABNORMAL HIGH (ref <200)
HDL: 54 mg/dL (ref >=50)
LDL Cholesterol (Calc): 118 mg/dL (calc) — ABNORMAL HIGH
Non-HDL Cholesterol (Calc): 154 mg/dL (calc) — ABNORMAL HIGH (ref <130)
Total CHOL/HDL Ratio: 3.9 (calc) (ref <5.0)
Triglycerides: 249 mg/dL — ABNORMAL HIGH (ref <150)

## 2022-10-21 LAB — VITAMIN D 25 HYDROXY (VIT D DEFICIENCY, FRACTURES): Vit D, 25-Hydroxy: 80 ng/mL (ref 30–100)

## 2022-10-28 ENCOUNTER — Other Ambulatory Visit: Payer: Self-pay | Admitting: Internal Medicine

## 2022-10-28 LAB — EXTRA LAV TOP TUBE

## 2022-10-29 LAB — MEASLES/MUMPS/RUBELLA IMMUNITY
Mumps IgG: 126 AU/mL
Rubella: 7.3 Index
Rubeola IgG: <13.5 AU/mL — ABNORMAL LOW

## 2022-10-29 LAB — VARICELLA ZOSTER ANTIBODY, IGG: Varicella IgG: 1016 index

## 2023-01-12 ENCOUNTER — Emergency Department (HOSPITAL_BASED_OUTPATIENT_CLINIC_OR_DEPARTMENT_OTHER): Payer: PRIVATE HEALTH INSURANCE

## 2023-01-12 ENCOUNTER — Emergency Department (HOSPITAL_BASED_OUTPATIENT_CLINIC_OR_DEPARTMENT_OTHER)
Admission: EM | Admit: 2023-01-12 | Discharge: 2023-01-13 | Disposition: A | Discharge status: Home / Self Care | Payer: PRIVATE HEALTH INSURANCE

## 2023-01-12 ENCOUNTER — Other Ambulatory Visit: Payer: Self-pay

## 2023-01-12 DIAGNOSIS — R197 Diarrhea, unspecified: Secondary | ICD-10-CM | POA: Diagnosis not present

## 2023-01-12 DIAGNOSIS — R1031 Right lower quadrant pain: Secondary | ICD-10-CM | POA: Insufficient documentation

## 2023-01-12 DIAGNOSIS — R1030 Lower abdominal pain, unspecified: Secondary | ICD-10-CM | POA: Diagnosis present

## 2023-01-12 DIAGNOSIS — M79604 Pain in right leg: Secondary | ICD-10-CM | POA: Insufficient documentation

## 2023-01-12 DIAGNOSIS — R1032 Left lower quadrant pain: Secondary | ICD-10-CM | POA: Diagnosis not present

## 2023-01-12 LAB — URINALYSIS, ROUTINE W REFLEX MICROSCOPIC
Bilirubin Urine: NEGATIVE
Glucose, UA: NEGATIVE mg/dL
Hgb urine dipstick: NEGATIVE
Ketones, ur: NEGATIVE mg/dL
Leukocytes,Ua: NEGATIVE
Nitrite: NEGATIVE
Protein, ur: NEGATIVE mg/dL
Specific Gravity, Urine: 1.008 (ref 1.005–1.030)
pH: 5 (ref 5.0–8.0)

## 2023-01-12 LAB — COMPREHENSIVE METABOLIC PANEL
ALT: 49 U/L — ABNORMAL HIGH (ref 0–44)
AST: 31 U/L (ref 15–41)
Albumin: 4.7 g/dL (ref 3.5–5.0)
Alkaline Phosphatase: 108 U/L (ref 38–126)
Anion gap: 8 (ref 5–15)
BUN: 12 mg/dL (ref 6–20)
CO2: 23 mmol/L (ref 22–32)
Calcium: 9.9 mg/dL (ref 8.9–10.3)
Chloride: 106 mmol/L (ref 98–111)
Creatinine, Ser: 1.02 mg/dL — ABNORMAL HIGH (ref 0.44–1.00)
GFR, Estimated: >60 mL/min (ref >60)
Glucose, Bld: 170 mg/dL — ABNORMAL HIGH (ref 70–99)
Potassium: 4.1 mmol/L (ref 3.5–5.1)
Sodium: 137 mmol/L (ref 135–145)
Total Bilirubin: 0.4 mg/dL (ref 0.3–1.2)
Total Protein: 7.5 g/dL (ref 6.5–8.1)

## 2023-01-12 LAB — CBC
HCT: 40.1 % (ref 36.0–46.0)
Hemoglobin: 13.9 g/dL (ref 12.0–15.0)
MCH: 31.2 pg (ref 26.0–34.0)
MCHC: 34.7 g/dL (ref 30.0–36.0)
MCV: 90.1 fL (ref 80.0–100.0)
Platelets: 324 10*3/uL (ref 150–400)
RBC: 4.45 MIL/uL (ref 3.87–5.11)
RDW: 12.4 % (ref 11.5–15.5)
WBC: 6.1 10*3/uL (ref 4.0–10.5)
nRBC: 0 % (ref 0.0–0.2)

## 2023-01-12 LAB — LIPASE, BLOOD: Lipase: 51 U/L (ref 11–51)

## 2023-01-12 MED ORDER — DICYCLOMINE HCL 20 MG PO TABS: 20.0000 mg | ORAL_TABLET | Freq: Two times a day (BID) | ORAL | 0 refills | Status: DC

## 2023-01-12 MED ORDER — MORPHINE SULFATE (PF) 4 MG/ML IV SOLN
4.0000 mg | Freq: Once | INTRAVENOUS | Status: AC
  Administered 2023-01-12: 4 mg via INTRAVENOUS
  Filled 2023-01-12: qty 1

## 2023-01-12 MED ORDER — IOHEXOL 300 MG/ML  SOLN
100.0000 mL | Freq: Once | INTRAMUSCULAR | Status: AC | PRN
  Administered 2023-01-12: 80 mL via INTRAVENOUS

## 2023-01-12 MED ORDER — SODIUM CHLORIDE 0.9 % IV BOLUS
1000.0000 mL | Freq: Once | INTRAVENOUS | Status: AC
  Administered 2023-01-12: 1000 mL via INTRAVENOUS

## 2023-01-12 MED ORDER — DICYCLOMINE HCL 10 MG PO CAPS
10.0000 mg | ORAL_CAPSULE | Freq: Once | ORAL | Status: AC
  Administered 2023-01-12: 10 mg via ORAL
  Filled 2023-01-12: qty 1

## 2023-01-12 NOTE — ED Triage Notes (Signed)
Lower abd pain starting this AM. -N,-V, +D. Seen at [facility]- sent here for follow up. -CP,-SOB. Denies fevers, body aches, resp issues.  HX IBS.

## 2023-01-12 NOTE — ED Provider Notes (Signed)
[place] EMERGENCY DEPARTMENT AT [facility] Provider Note   CSN: [id number] Arrival date & time: 01/12/23  1521     History  Chief Complaint  Patient presents with   Abdominal Pain    [name] is a 52 y.o. female.  With a history of IBS, GERD who presents to the ED for evaluation of lower abdominal pain.  States this pain began this morning at approximately 7 AM.  She states she has had numerous episodes of diarrhea as well.  Described as nonbloody.  Pain does not radiate from the lower abdomen but it is across the entire lower abdomen.  Feels like when she has had episodes of IBS flareups in the past.  She has not taken anything for her symptoms.  No fevers or chills.  No nausea or vomiting.  No recent travel or suspicious food intake.  She denies smoking, alcohol use, recreational drug use.  She initially presented to urgent care but was encouraged to present to the ED for further evaluation.   Abdominal Pain Associated symptoms: diarrhea        Home Medications Prior to Admission medications   Medication Sig Start Date End Date Taking? Authorizing Provider  dicyclomine (BENTYL) 20 MG tablet Take 1 tablet (20 mg total) by mouth 2 (two) times daily. 01/12/23  Yes Toluwanimi Radebaugh, Edsel Petrin, PA-C  albuterol (PROVENTIL HFA;VENTOLIN HFA) 108 [phone] Base) MCG/ACT inhaler  03/29/18   [provider]  albuterol (VENTOLIN HFA) 108 (90 Base) MCG/ACT inhaler 1-2 inhalations every 4-6 hours as needed for wheezing. Dispense spacer as needed. 03/29/18 03/29/19  [provider]  amitriptyline (ELAVIL) 50 MG tablet Take 50 mg by mouth at bedtime.    [provider]  azelastine (ASTELIN) 0.1 % nasal spray Place 2 sprays into both nostrils 2 (two) times daily. 01/20/20   Cathie Hoops, Amy V, PA-C  ciprofloxacin (CIPRO) 500 MG tablet Take 1 tablet (500 mg total) by mouth every 12 (twelve) hours. 03/12/21   Honor Loh M, PA-C  clonazePAM (KLONOPIN) 0.5 MG tablet Take 0.5 mg  by mouth daily as needed for anxiety.  03/01/18   [provider]  cyclobenzaprine (FLEXERIL) 10 MG tablet Take 1 tablet (10 mg total) by mouth 3 (three) times daily as needed for muscle spasms. 03/19/18   Azalia Bilis, MD  desloratadine (CLARINEX) 5 MG tablet Take 1 tablet by mouth daily. 11/27/20   [provider]  diclofenac sodium (VOLTAREN) 1 % GEL Apply 2 g topically 4 (four) times daily. Rub into affected area of foot 2 to 4 times daily Patient not taking: Reported on 11/01/2020 06/29/18   Vivi Barrack, DPM  dicyclomine (BENTYL) 20 MG tablet Take 1 tablet (20 mg total) by mouth 3 (three) times daily before meals. 02/10/18   Brock Bad, MD  escitalopram (LEXAPRO) 10 MG tablet Take 10 mg by mouth daily.    [provider]  fluticasone (FLONASE) 50 MCG/ACT nasal spray Place 1-2 sprays into both nostrils daily. 01/02/20   Wieters, Hallie C, PA-C  gabapentin (NEURONTIN) 300 MG capsule Take 1 capsule (300 mg total) by mouth 3 (three) times daily as needed for up to 21 doses. 08/16/22   Terald Sleeper, MD  ibuprofen (ADVIL) 600 MG tablet Take 1 tablet (600 mg total) by mouth every 8 (eight) hours as needed for up to 21 doses for moderate pain. 08/16/22   Terald Sleeper, MD  LINZESS 145 MCG CAPS capsule Take 145 mcg by mouth  every morning. 06/09/18   [provider]  loratadine (CLARITIN) 10 MG tablet Take 1 tablet (10 mg total) by mouth daily. Patient not taking: Reported on 03/12/2021 08/31/19   Hall-Potvin, [place], PA-C  methylPREDNISolone (MEDROL DOSEPAK) 4 MG TBPK tablet Take as directed on package 08/17/22   Terald Sleeper, MD  metroNIDAZOLE (FLAGYL) 500 MG tablet Take 1 tablet (500 mg total) by mouth 2 (two) times daily. 03/12/21   Teressa Lower, PA-C  Multiple Vitamin (MULTIVITAMIN WITH MINERALS) TABS Take 1 tablet by mouth at bedtime.     [provider]  omeprazole (PRILOSEC) 10 MG capsule Take 10 mg by mouth daily.    [provider]  zolpidem (AMBIEN) 10 MG tablet Take 10 mg by mouth at bedtime.     [provider]  promethazine (PHENERGAN) 25 MG suppository Place 1 suppository (25 mg total) rectally every 6 (six) hours as needed for nausea. Patient not taking: Reported on 03/17/2018 08/04/11 08/31/19  Serena Colonel, MD  ranitidine (ZANTAC) 150 MG tablet Take 150 mg by mouth 2 (two) times daily.  08/31/19  [provider]      Allergies    Hydrocodone-acetaminophen, Vicodin [hydrocodone-acetaminophen], and Penicillins    Review of Systems   Review of Systems  Gastrointestinal:  Positive for abdominal pain and diarrhea.  All other systems reviewed and are negative.   Physical Exam Updated Vital Signs BP (!) 139/96 (BP Location: Right Arm)   Pulse 77   Temp 98.4 F (36.9 C) (Oral)   Resp 18   LMP 02/14/2018   SpO2 98%  Physical Exam Vitals and nursing note reviewed.  Constitutional:      General: She is not in acute distress.    Appearance: She is well-developed. She is obese.     Comments: Resting comfortably in bed  HENT:     Head: Normocephalic and atraumatic.  Eyes:     Conjunctiva/sclera: Conjunctivae normal.  Cardiovascular:     Rate and Rhythm: Normal rate and regular rhythm.     Heart sounds: No murmur heard. Pulmonary:     Effort: Pulmonary effort is normal. No respiratory distress.     Breath sounds: Normal breath sounds.  Abdominal:     Palpations: Abdomen is soft.     Tenderness: There is abdominal tenderness in the right lower quadrant, suprapubic area and left lower quadrant. There is guarding. Positive signs include McBurney's sign.  Musculoskeletal:        General: No swelling.     Cervical back: Neck supple.  Skin:    General: Skin is warm and dry.     Capillary Refill: Capillary refill takes less than 2 seconds.  Neurological:     Mental Status: She is alert.  Psychiatric:        Mood and Affect: Mood normal.     ED Results / Procedures /  Treatments   Labs (all labs ordered are listed, but only abnormal results are displayed) Labs Reviewed  COMPREHENSIVE METABOLIC PANEL - Abnormal; Notable for the following components:      Result Value   Glucose, Bld 170 (*)    Creatinine, Ser 1.02 (*)    ALT 49 (*)    All other components within normal limits  URINALYSIS, ROUTINE W REFLEX MICROSCOPIC - Abnormal; Notable for the following components:   Color, Urine COLORLESS (*)    All other components within normal limits  LIPASE, BLOOD  CBC    EKG None  Radiology CT  ABDOMEN PELVIS W CONTRAST  Result Date: 01/12/2023 CLINICAL DATA:  Right lower quadrant abdominal pain. EXAM: CT ABDOMEN AND PELVIS WITH CONTRAST TECHNIQUE: Multidetector CT imaging of the abdomen and pelvis was performed using the standard protocol following bolus administration of intravenous contrast. RADIATION DOSE REDUCTION: This exam was performed according to the departmental dose-optimization program which includes automated exposure control, adjustment of the mA and/or kV according to patient size and/or use of iterative reconstruction technique. CONTRAST:  80mL OMNIPAQUE IOHEXOL 300 MG/ML  SOLN COMPARISON:  CT abdomen pelvis dated 03/12/2021. FINDINGS: Lower chest: There is lung bases are clear. No intra-abdominal free air or free fluid. Hepatobiliary: Fatty liver. No biliary dilatation. The gallbladder is unremarkable. Pancreas: Unremarkable. No pancreatic ductal dilatation or surrounding inflammatory changes. Spleen: Normal in size without focal abnormality. Adrenals/Urinary Tract: The adrenal glands unremarkable. There is no hydronephrosis on either side. There is symmetric enhancement and excretion of contrast by both kidneys. The visualized ureters and urinary bladder appear unremarkable. Stomach/Bowel: Mild diffuse thickened appearance of the colon, likely related to underdistention. Colitis is less likely. There is no bowel obstruction. The appendix is normal.  Vascular/Lymphatic: Mild atherosclerotic calcification of the abdominal aorta. The IVC is unremarkable. Apparent small linear filling defect in the right femoral vein (77-82 of series 2) may be artifactual and related to mixing of contrast or represent a nonocclusive DVT, possibly an old clot or scarring. Duplex ultrasound may provide better evaluation if there is high clinical concern for DVT. No portal venous gas. There is no adenopathy. Reproductive: The uterus is anteverted. There is abutment of the uterine fundus to the right rectus sheath suspicious for adhesions. No adnexal masses. Other: None Musculoskeletal: No acute or significant osseous findings. IMPRESSION: 1. No acute intra-abdominal or pelvic pathology. Normal appendix. 2. Fatty liver. 3. Mixing artifact versus possible nonocclusive DVT in the right femoral vein. Duplex ultrasound may provide better evaluation if clinically indicated. 4.  Aortic Atherosclerosis (ICD10-I70.0). Electronically Signed   By: Elgie Collard M.D.   On: 01/12/2023 22:38    Procedures Procedures    Medications Ordered in ED Medications  sodium chloride 0.9 % bolus 1,000 mL (0 mLs Intravenous Stopped 01/12/23 2212)  dicyclomine (BENTYL) capsule 10 mg (10 mg Oral Given 01/12/23 2013)  morphine (PF) 4 MG/ML injection 4 mg (4 mg Intravenous Given 01/12/23 2012)  iohexol (OMNIPAQUE) 300 MG/ML solution 100 mL (80 mLs Intravenous Contrast Given 01/12/23 2034)    ED Course/ Medical Decision Making/ A&P Clinical Course as of 01/13/23 0008  Mon Jan 12, 2023  2228 Patient reporting significant improvement in her symptoms after treatment [AS]    Clinical Course User Index [AS] Lula Olszewski Edsel Petrin, PA-C                                 Medical Decision Making Amount and/or Complexity of Data Reviewed Labs: ordered. Radiology: ordered.  Risk Prescription drug management.   This patient presents to the ED for concern of lower abdominal pain, this involves an  extensive number of treatment options, and is a complaint that carries with it a high risk of complications and morbidity. Differential diagnosis of her lower abdominal considerations include pelvic inflammatory disease, ectopic pregnancy, appendicitis, urinary calculi, primary dysmenorrhea, septic abortion, ruptured ovarian cyst or tumor, ovarian torsion, tubo-ovarian abscess, degeneration of fibroid, endometriosis, diverticulitis, cystitis.   Co morbidities that complicate the patient evaluation  IBS, GERD  My initial workup includes  labs, imaging, symptom control  Additional history obtained from: Nursing notes from this visit. Previous records within EMR system ED visit on 03/12/2021 for similar  I ordered, reviewed and interpreted labs which include: CBC, CMP, lipase, urinalysis.  Labs within normal limits.  I ordered imaging studies including CT abdomen pelvis I independently visualized and interpreted imaging which showed no acute intra-abdominal abnormalities.  Possible nonocclusive DVT in the right femoral vein I agree with the radiologist interpretation  Afebrile, hemodynamically stable.  52 year old female presents the ED for evaluation of lower abdominal pain.  Has a history of IBS and infectious colitis.  States his symptoms feel similar to her IBS.  She has not tried any interventions prior to arrival.  She was sent here from urgent care for further evaluation.  On my exam, she has mild lower abdominal tenderness to palpation.  No rigidity.  Normal bowel sounds.  No overlying skin changes.  Lab workup was overall reassuring.  Imaging was negative for acute intra-abdominal abnormalities, however does show possible nonocclusive DVT in the right femoral vein.  She does report intermittent aching to the right lower extremity that has been going on for "quite some time."  Although likely not the reason for her lower abdominal pain, patient would like this further evaluated while in the  emergency department.  DVT study of the right lower extremity was ordered. Plan at the time of shift change is pending DVT study results. If positive, patient will be started on appropriate therapy. If negative, patient will be discharged home with GI follow up. Care will be handed off to Dr. Pilar Plate. Plan may change at the discretion of the oncoming provider.  Patient's case discussed with Dr. Maple Hudson who agrees with plan to discharge with follow-up with a normal DVT study.   Note: Portions of this report may have been transcribed using voice recognition software. Every effort was made to ensure accuracy; however, inadvertent computerized transcription errors may still be present.        Final Clinical Impression(s) / ED Diagnoses Final diagnoses:  Lower abdominal pain    Rx / DC Orders ED Discharge Orders          Ordered    dicyclomine (BENTYL) 20 MG tablet  2 times daily        01/12/23 2312              Michelle Piper, PA-C 01/13/23 0008    Coral Spikes, DO 01/13/23 1604

## 2023-01-12 NOTE — Discharge Instructions (Signed)
You were evaluated in the Emergency Department and after careful evaluation, we did not find any emergent condition requiring admission or further testing in the hospital.  Your exam/testing today is overall reassuring.  Suspect symptoms due to a mild inflammation of your colon possibly due to a virus.  Can use the Bentyl medication as needed for crampy abdominal pain.  Can use the Imodium as needed for diarrhea.  Can use the Zofran as needed for any nausea.  Please return to the Emergency Department if you experience any worsening of your condition.   Thank you for allowing [place] to be a part of your care.

## 2023-01-13 MED ORDER — ONDANSETRON 4 MG PO TBDP: 4.0000 mg | ORAL_TABLET | Freq: Three times a day (TID) | ORAL | 0 refills | Status: DC | PRN

## 2023-01-13 MED ORDER — LOPERAMIDE HCL 2 MG PO CAPS: 2.0000 mg | ORAL_CAPSULE | Freq: Four times a day (QID) | ORAL | 0 refills | Status: DC | PRN

## 2023-01-13 NOTE — ED Provider Notes (Signed)
  Provider Note MRN:  [id number]  Arrival date & time: 01/13/23    ED Course and Medical Decision Making  Assumed care from PA Schutt at shift change.  Lower abdominal pain with negative workup, possibly a femoral vein DVT on the CT, awaiting definitive imaging with ultrasound.  Ultrasound negative for DVT, no emergent process appropriate for discharge.  Procedures  Final Clinical Impressions(s) / ED Diagnoses     ICD-10-CM   1. Lower abdominal pain  R10.30       ED Discharge Orders          Ordered    loperamide (IMODIUM) 2 MG capsule  4 times daily PRN        01/13/23 0204    ondansetron (ZOFRAN-ODT) 4 MG disintegrating tablet  Every 8 hours PRN        01/13/23 0204    dicyclomine (BENTYL) 20 MG tablet  2 times daily        01/12/23 2312              Discharge Instructions      You were evaluated in the Emergency Department and after careful evaluation, we did not find any emergent condition requiring admission or further testing in the hospital.  Your exam/testing today is overall reassuring.  Suspect symptoms due to a mild inflammation of your colon possibly due to a virus.  Can use the Bentyl medication as needed for crampy abdominal pain.  Can use the Imodium as needed for diarrhea.  Can use the Zofran as needed for any nausea.  Please return to the Emergency Department if you experience any worsening of your condition.   Thank you for allowing [place] to be a part of your care.    Elmer Sow. Pilar Plate, MD [facility] Emergency Medicine [facility] [email or web address]    Sabas Sous, MD 01/13/23 [phone]

## 2023-02-17 ENCOUNTER — Emergency Department (HOSPITAL_COMMUNITY)
Admission: EM | Admit: 2023-02-17 | Discharge: 2023-02-17 | Disposition: A | Discharge status: Home / Self Care | Payer: PRIVATE HEALTH INSURANCE | Attending: Emergency Medicine | Admitting: Emergency Medicine

## 2023-02-17 ENCOUNTER — Other Ambulatory Visit: Payer: Self-pay

## 2023-02-17 ENCOUNTER — Encounter (HOSPITAL_COMMUNITY): Payer: Self-pay

## 2023-02-17 DIAGNOSIS — M79605 Pain in left leg: Secondary | ICD-10-CM | POA: Insufficient documentation

## 2023-02-17 MED ORDER — KETOROLAC TROMETHAMINE 60 MG/2ML IM SOLN
30.0000 mg | Freq: Once | INTRAMUSCULAR | Status: AC
  Administered 2023-02-17: 30 mg via INTRAMUSCULAR
  Filled 2023-02-17: qty 2

## 2023-02-17 MED ORDER — ACETAMINOPHEN 500 MG PO TABS
1000.0000 mg | ORAL_TABLET | Freq: Once | ORAL | Status: DC
  Filled 2023-02-17: qty 2

## 2023-02-17 NOTE — ED Triage Notes (Signed)
Pt reports left leg pain. States she does not recall injury. Reports feels like bursitis. No recent travels, no swelling.

## 2023-02-17 NOTE — ED Provider Notes (Signed)
[place] EMERGENCY DEPARTMENT AT [facility] Provider Note   CSN: [id number] Arrival date & time: 02/17/23  0847     History  Chief Complaint  Patient presents with   Leg Pain    [name] is a 52 y.o. female.  52 year old female here today with left leg pain.  Patient says that she felt something similar to this about 6 months ago after she had had a fall.  Says that it felt better after a few days, however over the last 24 hours, she has had this increasing pain on the left side of her lateral leg.  She describes a pain in her mid thigh that extends around her lateral knee and into her shin.  She denies any recent travel, no history of blood clots.   Leg Pain      Home Medications Prior to Admission medications   Medication Sig Start Date End Date Taking? Authorizing Provider  albuterol (PROVENTIL HFA;VENTOLIN HFA) 108 (90 Base) MCG/ACT inhaler  03/29/18   [provider]  albuterol (VENTOLIN HFA) 108 (90 Base) MCG/ACT inhaler 1-2 inhalations every 4-6 hours as needed for wheezing. Dispense spacer as needed. 03/29/18 03/29/19  [provider]  amitriptyline (ELAVIL) 50 MG tablet Take 50 mg by mouth at bedtime.    [provider]  azelastine (ASTELIN) 0.1 % nasal spray Place 2 sprays into both nostrils 2 (two) times daily. 01/20/20   Cathie Hoops, Amy V, PA-C  ciprofloxacin (CIPRO) 500 MG tablet Take 1 tablet (500 mg total) by mouth every 12 (twelve) hours. 03/12/21   Honor Loh M, PA-C  clonazePAM (KLONOPIN) 0.5 MG tablet Take 0.5 mg by mouth daily as needed for anxiety.  03/01/18   [provider]  cyclobenzaprine (FLEXERIL) 10 MG tablet Take 1 tablet (10 mg total) by mouth 3 (three) times daily as needed for muscle spasms. 03/19/18   Azalia Bilis, MD  desloratadine (CLARINEX) 5 MG tablet Take 1 tablet by mouth daily. 11/27/20   [provider]  diclofenac sodium (VOLTAREN) 1 % GEL Apply 2 g topically 4 (four) times  daily. Rub into affected area of foot 2 to 4 times daily Patient not taking: Reported on 11/01/2020 06/29/18   Vivi Barrack, DPM  dicyclomine (BENTYL) 20 MG tablet Take 1 tablet (20 mg total) by mouth 3 (three) times daily before meals. 02/10/18   Brock Bad, MD  dicyclomine (BENTYL) 20 MG tablet Take 1 tablet (20 mg total) by mouth 2 (two) times daily. 01/12/23   Schutt, Edsel Petrin, PA-C  escitalopram (LEXAPRO) 10 MG tablet Take 10 mg by mouth daily.    [provider]  fluticasone (FLONASE) 50 MCG/ACT nasal spray Place 1-2 sprays into both nostrils daily. 01/02/20   Wieters, Hallie C, PA-C  gabapentin (NEURONTIN) 300 MG capsule Take 1 capsule (300 mg total) by mouth 3 (three) times daily as needed for up to 21 doses. 08/16/22   Terald Sleeper, MD  ibuprofen (ADVIL) 600 MG tablet Take 1 tablet (600 mg total) by mouth every 8 (eight) hours as needed for up to 21 doses for moderate pain. 08/16/22   Terald Sleeper, MD  LINZESS 145 MCG CAPS capsule Take 145 mcg by mouth every morning. 06/09/18   [provider]  loperamide (IMODIUM) 2 MG capsule Take 1 capsule (2 mg total) by mouth 4 (four) times daily as needed for diarrhea or loose stools. 01/13/23   Sabas Sous, MD  loratadine (CLARITIN) 10 MG  tablet Take 1 tablet (10 mg total) by mouth daily. Patient not taking: Reported on 03/12/2021 08/31/19   Hall-Potvin, [place], PA-C  methylPREDNISolone (MEDROL DOSEPAK) 4 MG TBPK tablet Take as directed on package 08/17/22   Terald Sleeper, MD  metroNIDAZOLE (FLAGYL) 500 MG tablet Take 1 tablet (500 mg total) by mouth 2 (two) times daily. 03/12/21   Teressa Lower, PA-C  Multiple Vitamin (MULTIVITAMIN WITH MINERALS) TABS Take 1 tablet by mouth at bedtime.     [provider]  omeprazole (PRILOSEC) 10 MG capsule Take 10 mg by mouth daily.    [provider]  ondansetron (ZOFRAN-ODT) 4 MG disintegrating tablet Take 1 tablet (4 mg total) by mouth every 8 (eight)  hours as needed for nausea or vomiting. 01/13/23   Sabas Sous, MD  zolpidem (AMBIEN) 10 MG tablet Take 10 mg by mouth at bedtime.     [provider]  promethazine (PHENERGAN) 25 MG suppository Place 1 suppository (25 mg total) rectally every 6 (six) hours as needed for nausea. Patient not taking: Reported on 03/17/2018 08/04/11 08/31/19  Serena Colonel, MD  ranitidine (ZANTAC) 150 MG tablet Take 150 mg by mouth 2 (two) times daily.  08/31/19  [provider]      Allergies    Hydrocodone-acetaminophen, Vicodin [hydrocodone-acetaminophen], and Penicillins    Review of Systems   Review of Systems  Physical Exam Updated Vital Signs BP (!) 131/91 (BP Location: Left Arm)   Pulse 87   Temp 98.4 F (36.9 C) (Oral)   Resp 16   Ht 5\' 4"  (1.626 m)   Wt 76.7 kg   LMP 02/14/2018   SpO2 100%   BMI 29.02 kg/m  Physical Exam Vitals and nursing note reviewed.  Cardiovascular:     Rate and Rhythm: Normal rate.     Pulses: Normal pulses.  Pulmonary:     Effort: Pulmonary effort is normal.  Musculoskeletal:        General: Tenderness present. No swelling or deformity. Normal range of motion.  Skin:    General: Skin is warm and dry.     Findings: No rash.  Neurological:     General: No focal deficit present.     Gait: Gait normal.     ED Results / Procedures / Treatments   Labs (all labs ordered are listed, but only abnormal results are displayed) Labs Reviewed - No data to display  EKG None  Radiology No results found.  Procedures Procedures    Medications Ordered in ED Medications  ketorolac (TORADOL) injection 30 mg (has no administration in time range)  acetaminophen (TYLENOL) tablet 1,000 mg (has no administration in time range)    ED Course/ Medical Decision Making/ A&P                                 Medical Decision Making 52 year old female here today.  Differential diagnoses include peripheral neuropathy, less likely DVT, less likely  fracture.  Plan-on exam, patient has an overall reassuring exam.  She does not have any swelling of the leg, any weakness, normal pulses.  I have low suspicion for a venous thrombus, or arterial injury.  Patient has not had any trauma, do not believe an x-ray would be useful.  When the patient previously presented for this and April of this past year, she had x-rays, ultrasound imaging done which were negative.  Symptoms then resolved.  I do  believe that this is a peripheral neuropathy problem.  Discussed this with the patient, will provide orthopedic follow-up.  Toradol and Tylenol provided in the emergency department.  Risk OTC drugs. Prescription drug management.           Final Clinical Impression(s) / ED Diagnoses Final diagnoses:  Left leg pain    Rx / DC Orders ED Discharge Orders     None         Arletha Pili, DO 02/17/23 [phone]

## 2023-02-17 NOTE — Discharge Instructions (Addendum)
Like we discussed, I think that your symptoms are caused by some irritation of one of the nerves in your leg.  You can take Tylenol and ibuprofen for this.  Included in your discharge paperwork is the telephone number for an orthopedic doctor.  You should give them a call this week to set up a follow-up appointment.  I would take the next 2 days off from work to rest your leg.

## 2023-05-05 ENCOUNTER — Emergency Department (HOSPITAL_BASED_OUTPATIENT_CLINIC_OR_DEPARTMENT_OTHER): Payer: PRIVATE HEALTH INSURANCE

## 2023-05-05 ENCOUNTER — Encounter (HOSPITAL_BASED_OUTPATIENT_CLINIC_OR_DEPARTMENT_OTHER): Payer: Self-pay | Admitting: *Deleted

## 2023-05-05 ENCOUNTER — Emergency Department (HOSPITAL_BASED_OUTPATIENT_CLINIC_OR_DEPARTMENT_OTHER)
Admission: EM | Admit: 2023-05-05 | Discharge: 2023-05-05 | Disposition: A | Discharge status: Home / Self Care | Payer: PRIVATE HEALTH INSURANCE | Attending: Emergency Medicine | Admitting: Emergency Medicine

## 2023-05-05 ENCOUNTER — Other Ambulatory Visit: Payer: Self-pay

## 2023-05-05 DIAGNOSIS — K580 Irritable bowel syndrome with diarrhea: Secondary | ICD-10-CM | POA: Insufficient documentation

## 2023-05-05 DIAGNOSIS — R109 Unspecified abdominal pain: Secondary | ICD-10-CM | POA: Diagnosis present

## 2023-05-05 DIAGNOSIS — Z20822 Contact with and (suspected) exposure to covid-19: Secondary | ICD-10-CM | POA: Insufficient documentation

## 2023-05-05 LAB — URINALYSIS, ROUTINE W REFLEX MICROSCOPIC
Bilirubin Urine: NEGATIVE
Glucose, UA: NEGATIVE mg/dL
Hgb urine dipstick: NEGATIVE
Ketones, ur: NEGATIVE mg/dL
Leukocytes,Ua: NEGATIVE
Nitrite: NEGATIVE
Protein, ur: NEGATIVE mg/dL
Specific Gravity, Urine: 1.021 (ref 1.005–1.030)
pH: 5.5 (ref 5.0–8.0)

## 2023-05-05 LAB — COMPREHENSIVE METABOLIC PANEL
ALT: 47 U/L — ABNORMAL HIGH (ref 0–44)
AST: 21 U/L (ref 15–41)
Albumin: 5.1 g/dL — ABNORMAL HIGH (ref 3.5–5.0)
Alkaline Phosphatase: 127 U/L — ABNORMAL HIGH (ref 38–126)
Anion gap: 10 (ref 5–15)
BUN: 12 mg/dL (ref 6–20)
CO2: 23 mmol/L (ref 22–32)
Calcium: 10.8 mg/dL — ABNORMAL HIGH (ref 8.9–10.3)
Chloride: 104 mmol/L (ref 98–111)
Creatinine, Ser: 1.15 mg/dL — ABNORMAL HIGH (ref 0.44–1.00)
GFR, Estimated: 57 mL/min — ABNORMAL LOW (ref >60)
Glucose, Bld: 114 mg/dL — ABNORMAL HIGH (ref 70–99)
Potassium: 3.9 mmol/L (ref 3.5–5.1)
Sodium: 137 mmol/L (ref 135–145)
Total Bilirubin: 0.5 mg/dL (ref 0.0–1.2)
Total Protein: 8.6 g/dL — ABNORMAL HIGH (ref 6.5–8.1)

## 2023-05-05 LAB — LIPASE, BLOOD: Lipase: 62 U/L — ABNORMAL HIGH (ref 11–51)

## 2023-05-05 LAB — RESP PANEL BY RT-PCR (RSV, FLU A&B, COVID)  RVPGX2
Influenza A by PCR: NEGATIVE
Influenza B by PCR: NEGATIVE
Resp Syncytial Virus by PCR: NEGATIVE
SARS Coronavirus 2 by RT PCR: NEGATIVE

## 2023-05-05 LAB — CBC
HCT: 40.4 % (ref 36.0–46.0)
Hemoglobin: 14.2 g/dL (ref 12.0–15.0)
MCH: 31.5 pg (ref 26.0–34.0)
MCHC: 35.1 g/dL (ref 30.0–36.0)
MCV: 89.6 fL (ref 80.0–100.0)
Platelets: 358 10*3/uL (ref 150–400)
RBC: 4.51 MIL/uL (ref 3.87–5.11)
RDW: 12.6 % (ref 11.5–15.5)
WBC: 8.1 10*3/uL (ref 4.0–10.5)
nRBC: 0 % (ref 0.0–0.2)

## 2023-05-05 MED ORDER — DICYCLOMINE HCL 20 MG PO TABS: 20.0000 mg | ORAL_TABLET | Freq: Two times a day (BID) | ORAL | 0 refills | Status: DC

## 2023-05-05 MED ORDER — LACTATED RINGERS IV BOLUS
1000.0000 mL | Freq: Once | INTRAVENOUS | Status: AC
  Administered 2023-05-05: 1000 mL via INTRAVENOUS

## 2023-05-05 MED ORDER — IOHEXOL 300 MG/ML  SOLN
100.0000 mL | Freq: Once | INTRAMUSCULAR | Status: AC | PRN
  Administered 2023-05-05: 85 mL via INTRAVENOUS

## 2023-05-05 MED ORDER — HYOSCYAMINE SULFATE 0.125 MG SL SUBL
0.2500 mg | SUBLINGUAL_TABLET | Freq: Once | SUBLINGUAL | Status: AC
  Administered 2023-05-05: 0.25 mg via SUBLINGUAL
  Filled 2023-05-05: qty 2

## 2023-05-05 MED ORDER — ONDANSETRON HCL 4 MG/2ML IJ SOLN
4.0000 mg | Freq: Once | INTRAMUSCULAR | Status: AC
  Administered 2023-05-05: 4 mg via INTRAVENOUS
  Filled 2023-05-05: qty 2

## 2023-05-05 MED ORDER — FENTANYL CITRATE PF 50 MCG/ML IJ SOSY
50.0000 ug | PREFILLED_SYRINGE | Freq: Once | INTRAMUSCULAR | Status: AC
  Administered 2023-05-05: 50 ug via INTRAVENOUS
  Filled 2023-05-05: qty 1

## 2023-05-05 NOTE — ED Triage Notes (Signed)
 Pt stating she is having an IBS flareup, she has been having abdominal spasms and diarrhea since around 2300. Nausea. Denies fevers.

## 2023-05-05 NOTE — ED Notes (Signed)
 Patient transported to CT via stretcher.

## 2023-05-05 NOTE — ED Provider Notes (Signed)
 [place] EMERGENCY DEPARTMENT AT [facility] Provider Note   CSN: [id number] Arrival date & time: 05/05/23  0315     History  Chief Complaint  Patient presents with   Abdominal Pain    [name] is a 52 y.o. female.   Abdominal Pain Associated symptoms: chills and diarrhea      52 year old female with medical history significant for IBS, GERD presenting to the emergency department with abdominal pain.  She has been having abdominal spasms and pain in the left upper quadrant and left lower quadrant with associated nausea and diarrhea since around 2300.  She is tolerating oral intake.  She denies any urinary symptoms.  No fevers but she is endorsing chills.  Home Medications Prior to Admission medications   Medication Sig Start Date End Date Taking? Authorizing Provider  albuterol (PROVENTIL HFA;VENTOLIN HFA) 108 (90 Base) MCG/ACT inhaler  03/29/18   [provider]  albuterol (VENTOLIN HFA) 108 (90 Base) MCG/ACT inhaler 1-2 inhalations every 4-6 hours as needed for wheezing. Dispense spacer as needed. 03/29/18 03/29/19  [provider]  amitriptyline (ELAVIL) 50 MG tablet Take 50 mg by mouth at bedtime.    [provider]  azelastine  (ASTELIN ) 0.1 % nasal spray Place 2 sprays into both nostrils 2 (two) times daily. 01/20/20   Babara, Amy V, PA-C  ciprofloxacin  (CIPRO ) 500 MG tablet Take 1 tablet (500 mg total) by mouth every 12 (twelve) hours. 03/12/21   Theotis Peers M, PA-C  clonazePAM  (KLONOPIN ) 0.5 MG tablet Take 0.5 mg by mouth daily as needed for anxiety.  03/01/18   [provider]  cyclobenzaprine  (FLEXERIL ) 10 MG tablet Take 1 tablet (10 mg total) by mouth 3 (three) times daily as needed for muscle spasms. 03/19/18   Baxter Drivers, MD  desloratadine (CLARINEX) 5 MG tablet Take 1 tablet by mouth daily. 11/27/20   [provider]  diclofenac  sodium (VOLTAREN ) 1 % GEL Apply 2 g topically 4 (four) times daily. Rub into  affected area of foot 2 to 4 times daily Patient not taking: Reported on 11/01/2020 06/29/18   Gershon Donnice SAUNDERS, DPM  dicyclomine  (BENTYL ) 20 MG tablet Take 1 tablet (20 mg total) by mouth 2 (two) times daily. 05/05/23   Jerrol Agent, MD  escitalopram (LEXAPRO) 10 MG tablet Take 10 mg by mouth daily.    [provider]  fluticasone  (FLONASE ) 50 MCG/ACT nasal spray Place 1-2 sprays into both nostrils daily. 01/02/20   Wieters, Hallie C, PA-C  gabapentin  (NEURONTIN ) 300 MG capsule Take 1 capsule (300 mg total) by mouth 3 (three) times daily as needed for up to 21 doses. 08/16/22   Cottie Donnice PARAS, MD  ibuprofen  (ADVIL ) 600 MG tablet Take 1 tablet (600 mg total) by mouth every 8 (eight) hours as needed for up to 21 doses for moderate pain. 08/16/22   Cottie Donnice PARAS, MD  LINZESS 145 MCG CAPS capsule Take 145 mcg by mouth every morning. 06/09/18   [provider]  loperamide  (IMODIUM ) 2 MG capsule Take 1 capsule (2 mg total) by mouth 4 (four) times daily as needed for diarrhea or loose stools. 01/13/23   Theadore Ozell HERO, MD  loratadine  (CLARITIN ) 10 MG tablet Take 1 tablet (10 mg total) by mouth daily. Patient not taking: Reported on 03/12/2021 08/31/19   Hall-Potvin, Brittany, PA-C  methylPREDNISolone  (MEDROL  DOSEPAK) 4 MG TBPK tablet Take as directed on package 08/17/22   Cottie Donnice PARAS, MD  metroNIDAZOLE  (FLAGYL ) 500 MG tablet Take  1 tablet (500 mg total) by mouth 2 (two) times daily. 03/12/21   Theotis Cameron HERO, PA-C  Multiple Vitamin (MULTIVITAMIN WITH MINERALS) TABS Take 1 tablet by mouth at bedtime.     [provider]  omeprazole (PRILOSEC) 10 MG capsule Take 10 mg by mouth daily.    [provider]  ondansetron  (ZOFRAN -ODT) 4 MG disintegrating tablet Take 1 tablet (4 mg total) by mouth every 8 (eight) hours as needed for nausea or vomiting. 01/13/23   Theadore Ozell HERO, MD  zolpidem (AMBIEN) 10 MG tablet Take 10 mg by mouth at bedtime.     [provider]  promethazine  (PHENERGAN ) 25 MG suppository Place 1 suppository (25 mg total) rectally every 6 (six) hours as needed for nausea. Patient not taking: Reported on 03/17/2018 08/04/11 08/31/19  Jesus Oliphant, MD  ranitidine (ZANTAC) 150 MG tablet Take 150 mg by mouth 2 (two) times daily.  08/31/19  [provider]      Allergies    Hydrocodone -acetaminophen , Vicodin [hydrocodone -acetaminophen ], and Penicillins    Review of Systems   Review of Systems  Constitutional:  Positive for chills.  Gastrointestinal:  Positive for abdominal pain and diarrhea.  All other systems reviewed and are negative.   Physical Exam Updated Vital Signs BP (!) 140/96 (BP Location: Right Arm)   Pulse 66   Temp 98.6 F (37 C) (Oral)   Resp 18   LMP 02/14/2018   SpO2 98%  Physical Exam Vitals and nursing note reviewed.  Constitutional:      General: She is not in acute distress.    Appearance: She is well-developed.  HENT:     Head: Normocephalic and atraumatic.  Eyes:     Conjunctiva/sclera: Conjunctivae normal.  Cardiovascular:     Rate and Rhythm: Normal rate and regular rhythm.  Pulmonary:     Effort: Pulmonary effort is normal. No respiratory distress.     Breath sounds: Normal breath sounds.  Abdominal:     Palpations: Abdomen is soft.     Tenderness: There is abdominal tenderness in the left upper quadrant and left lower quadrant. There is no guarding.  Musculoskeletal:        General: No swelling.     Cervical back: Neck supple.  Skin:    General: Skin is warm and dry.     Capillary Refill: Capillary refill takes less than 2 seconds.  Neurological:     Mental Status: She is alert.  Psychiatric:        Mood and Affect: Mood normal.     ED Results / Procedures / Treatments   Labs (all labs ordered are listed, but only abnormal results are displayed) Labs Reviewed  LIPASE, BLOOD - Abnormal; Notable for the following components:      Result Value   Lipase 62 (*)    All  other components within normal limits  COMPREHENSIVE METABOLIC PANEL - Abnormal; Notable for the following components:   Glucose, Bld 114 (*)    Creatinine, Ser 1.15 (*)    Calcium 10.8 (*)    Total Protein 8.6 (*)    Albumin 5.1 (*)    ALT 47 (*)    Alkaline Phosphatase 127 (*)    GFR, Estimated 57 (*)    All other components within normal limits  RESP PANEL BY RT-PCR (RSV, FLU A&B, COVID)  [id number]  CBC  URINALYSIS, ROUTINE W REFLEX MICROSCOPIC    EKG None  Radiology CT ABDOMEN PELVIS W CONTRAST Result Date: 05/05/2023  CLINICAL DATA:  Abdominal pain and diarrhea with nausea EXAM: CT ABDOMEN AND PELVIS WITH CONTRAST TECHNIQUE: Multidetector CT imaging of the abdomen and pelvis was performed using the standard protocol following bolus administration of intravenous contrast. RADIATION DOSE REDUCTION: This exam was performed according to the departmental dose-optimization program which includes automated exposure control, adjustment of the mA and/or kV according to patient size and/or use of iterative reconstruction technique. CONTRAST:  85mL OMNIPAQUE  IOHEXOL  300 MG/ML  SOLN COMPARISON:  CT abdomen and pelvis dated 01/12/2023 FINDINGS: Lower chest: No focal consolidation or pulmonary nodule in the lung bases. No pleural effusion or pneumothorax demonstrated. Partially imaged heart size is normal. Hepatobiliary: No focal hepatic lesions. No intra or extrahepatic biliary ductal dilation. Normal gallbladder. Pancreas: No focal lesions or main ductal dilation. Spleen: Normal in size without focal abnormality. Adrenals/Urinary Tract: No adrenal nodules. No suspicious renal mass, calculi or hydronephrosis. No focal bladder wall thickening. Stomach/Bowel: Normal appearance of the stomach. No abnormal bowel dilation or mural thickening. Mild diffuse colonic mucosal enhancement. Intraluminal fluid density and fluid levels within the colon. Normal appendix. Vascular/Lymphatic: Aortic atherosclerosis. No  enlarged abdominal or pelvic lymph nodes. Reproductive: No adnexal masses. Adherence of the uterine fundus to the right anterior abdominal wall, which may be postsurgical scarring. Other: No free fluid, fluid collection, or free air. Musculoskeletal: No acute or abnormal lytic or blastic osseous lesions. IMPRESSION: 1. Mild diffuse colonic mucosal enhancement with intraluminal fluid density and fluid levels within the colon, consistent with diarrheal illness. 2.  Aortic Atherosclerosis (ICD10-I70.0). Electronically Signed   By: Limin  Xu M.D.   On: 05/05/2023 09:09    Procedures Procedures    Medications Ordered in ED Medications  hyoscyamine  (LEVSIN  SL) SL tablet 0.25 mg (0.25 mg Sublingual Given 05/05/23 9347)  fentaNYL  (SUBLIMAZE ) injection 50 mcg (50 mcg Intravenous Given 05/05/23 0757)  lactated ringers  bolus 1,000 mL (1,000 mLs Intravenous New Bag/Given 05/05/23 0829)  ondansetron  (ZOFRAN ) injection 4 mg (4 mg Intravenous Given 05/05/23 0756)  iohexol  (OMNIPAQUE ) 300 MG/ML solution 100 mL (85 mLs Intravenous Contrast Given 05/05/23 0830)    ED Course/ Medical Decision Making/ A&P                                 Medical Decision Making Amount and/or Complexity of Data Reviewed Labs: ordered. Radiology: ordered.  Risk Prescription drug management.    52 year old female with medical history significant for IBS, GERD presenting to the emergency department with abdominal pain.  She has been having abdominal spasms and pain in the left upper quadrant and left lower quadrant with associated nausea and diarrhea since around 2300.  She is tolerating oral intake.  She denies any urinary symptoms.  No fevers but she is endorsing chills.  On arrival, the patient was afebrile, vitally stable, not tachycardic or tachypneic, saturating well on room air.  Physical exam revealed left lower quadrant tenderness to palpation with associated mild left upper quadrant tenderness.  Differential  diagnosis includes IBS flare, diverticulitis, nephrolithiasis, pyelonephritis, viral syndrome.  IV access was obtained and the patient was administered IV fluid bolus, IV fentanyl  and IV Zofran  for pain control and nausea control.  Laboratory evaluation revealed a lipase mildly elevated at 62, CBC without a leukocytosis or anemia, CMP with serum creatinine of 1.15, mildly elevated serum calcium to 10.8, ALT mildly elevated at 47, alkaline phosphatase 127, T. bili normal, no other significant electrolyte abnormality.  CT Abdomen  Pelvis:  IMPRESSION:  1. Mild diffuse colonic mucosal enhancement with intraluminal fluid  density and fluid levels within the colon, consistent with diarrheal  illness.  2.  Aortic Atherosclerosis (ICD10-I70.0).    Patient feeling symptomatically improved on repeat assessment, overall tolerating oral intake.  Laboratory evaluation and CT imaging overall without indication for acute hospitalization at this time.  Suspect likely IBS flareup with associated diarrhea and abdominal cramping.  Advised continued Bentyl , continued oral rehydration, outpatient follow-up with her PCP to ensure resolution, return precautions provided.    Final Clinical Impression(s) / ED Diagnoses Final diagnoses:  Irritable bowel syndrome with diarrhea    Rx / DC Orders ED Discharge Orders          Ordered    dicyclomine  (BENTYL ) 20 MG tablet  2 times daily        05/05/23 0941              Jerrol Agent, MD 05/05/23 [phone]

## 2023-05-05 NOTE — Discharge Instructions (Addendum)
Your CT imaging was consistent with a diarrheal illness.  Suspect likely flare of your IBS.  Recommend you take Bentyl at home for abdominal cramping as needed, continue oral rehydration, follow-up with your PCP to ensure resolution.

## 2023-05-27 ENCOUNTER — Ambulatory Visit: Payer: PRIVATE HEALTH INSURANCE

## 2023-05-27 ENCOUNTER — Other Ambulatory Visit (HOSPITAL_COMMUNITY)
Admission: RE | Admit: 2023-05-27 | Discharge: 2023-05-27 | Disposition: A | Discharge status: Home / Self Care | Payer: PRIVATE HEALTH INSURANCE | Source: Ambulatory Visit | Attending: Obstetrics & Gynecology | Admitting: Obstetrics & Gynecology

## 2023-05-27 VITALS — BP 134/94 | HR 89 | Ht 60.0 in | Wt 174.0 lb

## 2023-05-27 DIAGNOSIS — N898 Other specified noninflammatory disorders of vagina: Secondary | ICD-10-CM | POA: Diagnosis present

## 2023-05-27 NOTE — Progress Notes (Addendum)
SUBJECTIVE:  53 y.o. female complains of clear, copious, and foul vaginal discharge for 14 day(s). Denies abnormal vaginal bleeding or significant pelvic pain or fever. No UTI symptoms. Denies history of known exposure to STD.  Patient's last menstrual period was 02/14/2018.  OBJECTIVE:  She appears well, afebrile. Urine dipstick: not done.  ASSESSMENT:  Vaginal Discharge  Vaginal Odor Vaginal Itching   PLAN:  GC, chlamydia, trichomonas, BVAG, CVAG probe sent to lab. Treatment: To be determined once lab results are received ROV prn if symptoms persist or worsen.

## 2023-05-28 LAB — CERVICOVAGINAL ANCILLARY ONLY
Bacterial Vaginitis (gardnerella): NEGATIVE
Candida Glabrata: NEGATIVE
Candida Vaginitis: POSITIVE — AB
Chlamydia: NEGATIVE
Comment: NEGATIVE
Comment: NEGATIVE
Comment: NEGATIVE
Comment: NEGATIVE
Comment: NEGATIVE
Comment: NORMAL
Neisseria Gonorrhea: NEGATIVE
Trichomonas: NEGATIVE

## 2023-06-02 ENCOUNTER — Other Ambulatory Visit: Payer: Self-pay | Admitting: *Deleted

## 2023-06-02 MED ORDER — FLUCONAZOLE 150 MG PO TABS: 150.0000 mg | ORAL_TABLET | Freq: Once | ORAL | 0 refills | Status: AC

## 2023-06-02 NOTE — Progress Notes (Signed)
Diflucan sent for +yeast See lab results

## 2023-07-24 ENCOUNTER — Other Ambulatory Visit: Payer: Self-pay

## 2023-07-24 ENCOUNTER — Emergency Department (HOSPITAL_BASED_OUTPATIENT_CLINIC_OR_DEPARTMENT_OTHER): Payer: PRIVATE HEALTH INSURANCE

## 2023-07-24 ENCOUNTER — Encounter (HOSPITAL_BASED_OUTPATIENT_CLINIC_OR_DEPARTMENT_OTHER): Payer: Self-pay | Admitting: Emergency Medicine

## 2023-07-24 ENCOUNTER — Emergency Department (HOSPITAL_BASED_OUTPATIENT_CLINIC_OR_DEPARTMENT_OTHER)
Admission: EM | Admit: 2023-07-24 | Discharge: 2023-07-24 | Disposition: A | Discharge status: Home / Self Care | Payer: PRIVATE HEALTH INSURANCE | Attending: Emergency Medicine | Admitting: Emergency Medicine

## 2023-07-24 DIAGNOSIS — R1084 Generalized abdominal pain: Secondary | ICD-10-CM | POA: Insufficient documentation

## 2023-07-24 HISTORY — DX: Bursopathy, unspecified: M71.9

## 2023-07-24 LAB — COMPREHENSIVE METABOLIC PANEL
ALT: 35 U/L (ref 0–44)
AST: 26 U/L (ref 15–41)
Albumin: 4.9 g/dL (ref 3.5–5.0)
Alkaline Phosphatase: 111 U/L (ref 38–126)
Anion gap: 8 (ref 5–15)
BUN: 13 mg/dL (ref 6–20)
CO2: 26 mmol/L (ref 22–32)
Calcium: 11 mg/dL — ABNORMAL HIGH (ref 8.9–10.3)
Chloride: 105 mmol/L (ref 98–111)
Creatinine, Ser: 1.05 mg/dL — ABNORMAL HIGH (ref 0.44–1.00)
GFR, Estimated: >60 mL/min (ref >60)
Glucose, Bld: 97 mg/dL (ref 70–99)
Potassium: 3.8 mmol/L (ref 3.5–5.1)
Sodium: 139 mmol/L (ref 135–145)
Total Bilirubin: 0.5 mg/dL (ref 0.0–1.2)
Total Protein: 8 g/dL (ref 6.5–8.1)

## 2023-07-24 LAB — URINALYSIS, ROUTINE W REFLEX MICROSCOPIC
Bilirubin Urine: NEGATIVE
Glucose, UA: NEGATIVE mg/dL
Hgb urine dipstick: NEGATIVE
Ketones, ur: NEGATIVE mg/dL
Nitrite: NEGATIVE
Protein, ur: NEGATIVE mg/dL
Specific Gravity, Urine: 1.021 (ref 1.005–1.030)
pH: 6.5 (ref 5.0–8.0)

## 2023-07-24 LAB — CBC
HCT: 41.1 % (ref 36.0–46.0)
Hemoglobin: 14.3 g/dL (ref 12.0–15.0)
MCH: 31.4 pg (ref 26.0–34.0)
MCHC: 34.8 g/dL (ref 30.0–36.0)
MCV: 90.1 fL (ref 80.0–100.0)
Platelets: 349 10*3/uL (ref 150–400)
RBC: 4.56 MIL/uL (ref 3.87–5.11)
RDW: 12.2 % (ref 11.5–15.5)
WBC: 5.4 10*3/uL (ref 4.0–10.5)
nRBC: 0 % (ref 0.0–0.2)

## 2023-07-24 LAB — LIPASE, BLOOD: Lipase: 51 U/L (ref 11–51)

## 2023-07-24 MED ORDER — DICYCLOMINE HCL 20 MG PO TABS: 20.0000 mg | ORAL_TABLET | Freq: Two times a day (BID) | ORAL | 0 refills | Status: DC

## 2023-07-24 MED ORDER — MORPHINE SULFATE (PF) 4 MG/ML IV SOLN
4.0000 mg | Freq: Once | INTRAVENOUS | Status: AC
  Administered 2023-07-24: 4 mg via INTRAVENOUS
  Filled 2023-07-24: qty 1

## 2023-07-24 MED ORDER — IOHEXOL 350 MG/ML SOLN: 100.0000 mL | Freq: Once | INTRAVENOUS | Status: DC | PRN

## 2023-07-24 MED ORDER — IOHEXOL 300 MG/ML  SOLN
100.0000 mL | Freq: Once | INTRAMUSCULAR | Status: AC | PRN
  Administered 2023-07-24: 100 mL via INTRAVENOUS

## 2023-07-24 MED ORDER — ONDANSETRON HCL 4 MG/2ML IJ SOLN
4.0000 mg | Freq: Once | INTRAMUSCULAR | Status: AC
  Administered 2023-07-24: 4 mg via INTRAVENOUS
  Filled 2023-07-24: qty 2

## 2023-07-24 NOTE — ED Notes (Addendum)
 Pt called from lobby to speak with manager, this RN went to speak with pt and she endorses concern for people coming in to dept after her that were being called for triage ahead of her. Explained that ED doesn't allow all pts to be pulled in by time order. Pt then stated that "I see how it is". I informed her that we would get her triaged as soon as possible and she stated she would call the director.

## 2023-07-24 NOTE — ED Triage Notes (Signed)
 Pt via pov from home with IBS flare up that began this morning. She reports that she takes medications for it and she has flare ups occasionally. Pt endorses mid abdominal pain; nausea. Denies emesis. Pt alert & oriented, nad noted.

## 2023-07-24 NOTE — Discharge Instructions (Signed)
 You have been seen today for your complaint of abdominal pain. Your lab work was reassuring. Your imaging showed no acute abnormalities but did show adhesions in your lower abdomen from your C-section. Your discharge medications include Bentyl.  Take this as needed. Alternate tylenol and ibuprofen for pain. You may alternate these every 4 hours. You may take up to 800 mg of ibuprofen at a time and up to 1000 mg of tylenol. Follow up with: Your primary care provider Please seek immediate medical care if you develop any of the following symptoms: You have very bad pain in your belly or pelvis. The pain in your belly or pelvis gets worse. You throw up each time you eat or drink. At this time there does not appear to be the presence of an emergent medical condition, however there is always the potential for conditions to change. Please read and follow the below instructions.  Do not take your medicine if  develop an itchy rash, swelling in your mouth or lips, or difficulty breathing; call 911 and seek immediate emergency medical attention if this occurs.  You may review your lab tests and imaging results in their entirety on your MyChart account.  Please discuss all results of fully with your primary care provider and other specialist at your follow-up visit.  Note: Portions of this text may have been transcribed using voice recognition software. Every effort was made to ensure accuracy; however, inadvertent computerized transcription errors may still be present.

## 2023-07-24 NOTE — ED Provider Notes (Signed)
 [place] EMERGENCY DEPARTMENT AT [facility] Provider Note   CSN: [id number] Arrival date & time: 07/24/23  1122     History  Chief Complaint  Patient presents with   Abdominal Pain    [name] is a 53 y.o. female.  With a history of IBS, GERD presenting to the ED for evaluation of abdominal pain.  She believes her symptoms are secondary to an IBS flareup.  Symptoms began this morning.  States it feels like her typical flareup of IBS.  She has not had a bowel movement today.  She has not had any blood in her stool leading up to today.  She reports some nausea but no vomiting.  No fevers.  No dysuria, frequency, urgency, vaginal pain, itching, odor, discharge.  No chest pain or shortness of breath.  She still has her appendix.  She states she typically needs to take Bentyl when her symptoms flareup.  She states her last colonoscopy was 4 years ago and this was when she was told she has IBS.   Abdominal Pain      Home Medications Prior to Admission medications   Medication Sig Start Date End Date Taking? Authorizing Provider  dicyclomine (BENTYL) 20 MG tablet Take 1 tablet (20 mg total) by mouth 2 (two) times daily for 3 days. 07/24/23 07/27/23 Yes Ivanell Deshotel, Edsel Petrin, PA-C  albuterol (PROVENTIL HFA;VENTOLIN HFA) 108 [phone] Base) MCG/ACT inhaler  03/29/18   [provider]  albuterol (VENTOLIN HFA) 108 (90 Base) MCG/ACT inhaler 1-2 inhalations every 4-6 hours as needed for wheezing. Dispense spacer as needed. 03/29/18 03/29/19  [provider]  amitriptyline (ELAVIL) 50 MG tablet Take 50 mg by mouth at bedtime.    [provider]  azelastine (ASTELIN) 0.1 % nasal spray Place 2 sprays into both nostrils 2 (two) times daily. 01/20/20   Cathie Hoops, Amy V, PA-C  ciprofloxacin (CIPRO) 500 MG tablet Take 1 tablet (500 mg total) by mouth every 12 (twelve) hours. 03/12/21   Honor Loh M, PA-C  clonazePAM (KLONOPIN) 0.5 MG tablet Take 0.5 mg by mouth daily as  needed for anxiety.  03/01/18   [provider]  cyclobenzaprine (FLEXERIL) 10 MG tablet Take 1 tablet (10 mg total) by mouth 3 (three) times daily as needed for muscle spasms. 03/19/18   Azalia Bilis, MD  desloratadine (CLARINEX) 5 MG tablet Take 1 tablet by mouth daily. 11/27/20   [provider]  diclofenac sodium (VOLTAREN) 1 % GEL Apply 2 g topically 4 (four) times daily. Rub into affected area of foot 2 to 4 times daily Patient not taking: Reported on 11/01/2020 06/29/18   Vivi Barrack, DPM  escitalopram (LEXAPRO) 10 MG tablet Take 10 mg by mouth daily.    [provider]  fluticasone (FLONASE) 50 MCG/ACT nasal spray Place 1-2 sprays into both nostrils daily. 01/02/20   Wieters, Hallie C, PA-C  gabapentin (NEURONTIN) 300 MG capsule Take 1 capsule (300 mg total) by mouth 3 (three) times daily as needed for up to 21 doses. 08/16/22   Terald Sleeper, MD  ibuprofen (ADVIL) 600 MG tablet Take 1 tablet (600 mg total) by mouth every 8 (eight) hours as needed for up to 21 doses for moderate pain. 08/16/22   Terald Sleeper, MD  LINZESS 145 MCG CAPS capsule Take 145 mcg by mouth every morning. 06/09/18   [provider]  loperamide (IMODIUM) 2 MG capsule Take 1 capsule (2 mg total) by mouth 4 (four) times daily  as needed for diarrhea or loose stools. 01/13/23   Sabas Sous, MD  loratadine (CLARITIN) 10 MG tablet Take 1 tablet (10 mg total) by mouth daily. Patient not taking: Reported on 03/12/2021 08/31/19   Hall-Potvin, [place], PA-C  methylPREDNISolone (MEDROL DOSEPAK) 4 MG TBPK tablet Take as directed on package 08/17/22   Terald Sleeper, MD  metroNIDAZOLE (FLAGYL) 500 MG tablet Take 1 tablet (500 mg total) by mouth 2 (two) times daily. 03/12/21   Teressa Lower, PA-C  Multiple Vitamin (MULTIVITAMIN WITH MINERALS) TABS Take 1 tablet by mouth at bedtime.     [provider]  omeprazole (PRILOSEC) 10 MG capsule Take 10 mg by mouth daily. Patient  not taking: Reported on 05/27/2023    [provider]  ondansetron (ZOFRAN-ODT) 4 MG disintegrating tablet Take 1 tablet (4 mg total) by mouth every 8 (eight) hours as needed for nausea or vomiting. Patient not taking: Reported on 05/27/2023 01/13/23   Sabas Sous, MD  zolpidem (AMBIEN) 10 MG tablet Take 10 mg by mouth at bedtime.     [provider]  promethazine (PHENERGAN) 25 MG suppository Place 1 suppository (25 mg total) rectally every 6 (six) hours as needed for nausea. Patient not taking: Reported on 03/17/2018 08/04/11 08/31/19  Serena Colonel, MD  ranitidine (ZANTAC) 150 MG tablet Take 150 mg by mouth 2 (two) times daily.  08/31/19  [provider]      Allergies    Hydrocodone-acetaminophen, Vicodin [hydrocodone-acetaminophen], and Penicillins    Review of Systems   Review of Systems  Gastrointestinal:  Positive for abdominal pain.  All other systems reviewed and are negative.   Physical Exam Updated Vital Signs BP (!) 131/96 (BP Location: Right Arm)   Pulse 73   Temp 98.6 F (37 C) (Oral)   Resp 16   Ht 5\' 4"  (1.626 m)   Wt 78.5 kg   LMP 02/14/2018   SpO2 98%   BMI 29.70 kg/m  Physical Exam Vitals and nursing note reviewed.  Constitutional:      General: She is not in acute distress.    Appearance: Normal appearance. She is normal weight. She is not ill-appearing.     Comments: Resting comfortably in bed  HENT:     Head: Normocephalic and atraumatic.  Pulmonary:     Effort: Pulmonary effort is normal. No respiratory distress.  Abdominal:     General: Abdomen is flat.     Tenderness: There is abdominal tenderness in the epigastric area, periumbilical area and suprapubic area. There is no guarding. Negative signs include Rovsing's sign, McBurney's sign, psoas sign and obturator sign.     Comments: Negative heeltap test  Musculoskeletal:        General: Normal range of motion.     Cervical back: Neck supple.  Skin:    General: Skin is  warm and dry.  Neurological:     Mental Status: She is alert and oriented to person, place, and time.  Psychiatric:        Mood and Affect: Mood normal.        Behavior: Behavior normal.     ED Results / Procedures / Treatments   Labs (all labs ordered are listed, but only abnormal results are displayed) Labs Reviewed  COMPREHENSIVE METABOLIC PANEL - Abnormal; Notable for the following components:      Result Value   Creatinine, Ser 1.05 (*)    Calcium 11.0 (*)    All other components within normal  limits  URINALYSIS, ROUTINE W REFLEX MICROSCOPIC - Abnormal; Notable for the following components:   APPearance HAZY (*)    Leukocytes,Ua SMALL (*)    Bacteria, UA RARE (*)    All other components within normal limits  LIPASE, BLOOD  CBC    EKG None  Radiology CT ABDOMEN PELVIS W CONTRAST Result Date: 07/24/2023 CLINICAL DATA:  Right lower quadrant pain. EXAM: CT ABDOMEN AND PELVIS WITH CONTRAST TECHNIQUE: Multidetector CT imaging of the abdomen and pelvis was performed using the standard protocol following bolus administration of intravenous contrast. RADIATION DOSE REDUCTION: This exam was performed according to the departmental dose-optimization program which includes automated exposure control, adjustment of the mA and/or kV according to patient size and/or use of iterative reconstruction technique. CONTRAST:  OMNIPAQUE IOHEXOL 300 MG/ML  SOLN COMPARISON:  05/05/2023 FINDINGS: Lower Chest: No acute findings. Hepatobiliary: No suspicious hepatic masses identified. Mild hepatic steatosis again noted. Gallbladder is unremarkable. No evidence of biliary ductal dilatation. Pancreas:  No mass or inflammatory changes. Spleen: Within normal limits in size and appearance. Adrenals/Urinary Tract: No suspicious masses identified. No evidence of ureteral calculi or hydronephrosis. Stomach/Bowel: No evidence of obstruction, inflammatory process or abnormal fluid collections. Normal appendix  visualized. Vascular/Lymphatic: No pathologically enlarged lymph nodes. No acute vascular findings. Reproductive: Adhesion of uterus to lower anterior abdominal wall muscles again seen. No evidence of mass, inflammatory process, or abnormal fluid collections. Other:  None. Musculoskeletal:  No suspicious bone lesions identified. IMPRESSION: No evidence of appendicitis or other acute findings. Chronic adhesion of uterus to anterior abdominal wall, likely due to prior C-section. Mild hepatic steatosis. Electronically Signed   By: Danae Orleans M.D.   On: 07/24/2023 17:05    Procedures Procedures    Medications Ordered in ED Medications  iohexol (OMNIPAQUE) 350 MG/ML injection 100 mL (has no administration in time range)  ondansetron (ZOFRAN) injection 4 mg (4 mg Intravenous Given 07/24/23 1417)  morphine (PF) 4 MG/ML injection 4 mg (4 mg Intravenous Given 07/24/23 1418)  iohexol (OMNIPAQUE) 300 MG/ML solution 100 mL (100 mLs Intravenous Contrast Given 07/24/23 1505)    ED Course/ Medical Decision Making/ A&P                                 Medical Decision Making Amount and/or Complexity of Data Reviewed Labs: ordered. Radiology: ordered.  Risk Prescription drug management.  This patient presents to the ED for concern of abdominal pain, this involves an extensive number of treatment options, and is a complaint that carries with it a high risk of complications and morbidity.  The differential diagnosis for generalized abdominal pain includes, but is not limited to AAA, gastroenteritis, appendicitis, Bowel obstruction, Bowel perforation. Gastroparesis, DKA, Hernia, Inflammatory bowel disease, mesenteric ischemia, pancreatitis, peritonitis SBP, volvulus.   My initial workup includes labs, imaging, symptom control  Additional history obtained from: Nursing notes from this visit.  I ordered, reviewed and interpreted labs which include: CBC, CMP, Lipase, urinalysis.  No leukocytosis or anemia.   No electrolyte derangement or kidney dysfunction.  Normal LFTs.  Lipase normal.  Urine without evidence of infection.  I ordered imaging studies including CT abdomen pelvis I independently visualized and interpreted imaging which showed no acute intra-abdominal abnormalities.  Chronic adhesion of the uterus to the anterior abdominal wall, mild hepatic steatosis I agree with the radiologist interpretation  Afebrile, hemodynamically stable.  53 year old female presenting to ED for evaluation of midline  abdominal pain.  She is wondering if this is due to her IBS.  No fevers.  No nausea or vomiting.  Lab workup reassuring.  CT abdomen pelvis without acute findings but does show chronic adhesions of the uterus to the abdominal wall.  This is near where her pain is, this may be contributing to her symptoms.  She reported improvement in her symptoms after treatment in the emergency department.  Will send a prescription for dicyclomine at patient request.  She is encouraged to follow-up with her primary care provider.  Lower suspicion for acute emergent intra-abdominal abnormalities.  She was given return precautions.  Stable at discharge.  At this time there does not appear to be any evidence of an acute emergency medical condition and the patient appears stable for discharge with appropriate outpatient follow up. Diagnosis was discussed with patient who verbalizes understanding of care plan and is agreeable to discharge. I have discussed return precautions with patient who verbalizes understanding. Patient encouraged to follow-up with their PCP within 1 week. All questions answered.  Note: Portions of this report may have been transcribed using voice recognition software. Every effort was made to ensure accuracy; however, inadvertent computerized transcription errors may still be present.        Final Clinical Impression(s) / ED Diagnoses Final diagnoses:  Generalized abdominal pain    Rx / DC  Orders ED Discharge Orders          Ordered    dicyclomine (BENTYL) 20 MG tablet  2 times daily        07/24/23 1721              Mora Bellman 07/24/23 1727    Tegeler, Canary Brim, MD 07/30/23 2239

## 2023-09-08 ENCOUNTER — Other Ambulatory Visit: Payer: Self-pay | Admitting: Internal Medicine

## 2023-09-08 DIAGNOSIS — Z1231 Encounter for screening mammogram for malignant neoplasm of breast: Secondary | ICD-10-CM

## 2023-09-16 ENCOUNTER — Other Ambulatory Visit (HOSPITAL_BASED_OUTPATIENT_CLINIC_OR_DEPARTMENT_OTHER): Payer: Self-pay

## 2023-09-16 ENCOUNTER — Other Ambulatory Visit: Payer: Self-pay

## 2023-09-16 ENCOUNTER — Emergency Department (HOSPITAL_BASED_OUTPATIENT_CLINIC_OR_DEPARTMENT_OTHER)
Admission: EM | Admit: 2023-09-16 | Discharge: 2023-09-16 | Disposition: A | Discharge status: Home / Self Care | Payer: PRIVATE HEALTH INSURANCE | Attending: Emergency Medicine | Admitting: Emergency Medicine

## 2023-09-16 ENCOUNTER — Encounter (HOSPITAL_BASED_OUTPATIENT_CLINIC_OR_DEPARTMENT_OTHER): Payer: Self-pay | Admitting: Emergency Medicine

## 2023-09-16 DIAGNOSIS — R1013 Epigastric pain: Secondary | ICD-10-CM | POA: Insufficient documentation

## 2023-09-16 LAB — PREGNANCY, URINE: Preg Test, Ur: NEGATIVE

## 2023-09-16 LAB — COMPREHENSIVE METABOLIC PANEL WITH GFR
ALT: 36 U/L (ref 0–44)
AST: 22 U/L (ref 15–41)
Albumin: 4.3 g/dL (ref 3.5–5.0)
Alkaline Phosphatase: 114 U/L (ref 38–126)
Anion gap: 10 (ref 5–15)
BUN: 10 mg/dL (ref 6–20)
CO2: 25 mmol/L (ref 22–32)
Calcium: 10.3 mg/dL (ref 8.9–10.3)
Chloride: 106 mmol/L (ref 98–111)
Creatinine, Ser: 1.05 mg/dL — ABNORMAL HIGH (ref 0.44–1.00)
GFR, Estimated: >60 mL/min (ref >60)
Glucose, Bld: 118 mg/dL — ABNORMAL HIGH (ref 70–99)
Potassium: 3.8 mmol/L (ref 3.5–5.1)
Sodium: 141 mmol/L (ref 135–145)
Total Bilirubin: 0.4 mg/dL (ref 0.0–1.2)
Total Protein: 6.9 g/dL (ref 6.5–8.1)

## 2023-09-16 LAB — CBC
HCT: 36.4 % (ref 36.0–46.0)
Hemoglobin: 12.8 g/dL (ref 12.0–15.0)
MCH: 31.5 pg (ref 26.0–34.0)
MCHC: 35.2 g/dL (ref 30.0–36.0)
MCV: 89.7 fL (ref 80.0–100.0)
Platelets: 294 10*3/uL (ref 150–400)
RBC: 4.06 MIL/uL (ref 3.87–5.11)
RDW: 12.4 % (ref 11.5–15.5)
WBC: 5.3 10*3/uL (ref 4.0–10.5)
nRBC: 0 % (ref 0.0–0.2)

## 2023-09-16 LAB — URINALYSIS, ROUTINE W REFLEX MICROSCOPIC
Bilirubin Urine: NEGATIVE
Glucose, UA: NEGATIVE mg/dL
Hgb urine dipstick: NEGATIVE
Ketones, ur: NEGATIVE mg/dL
Leukocytes,Ua: NEGATIVE
Nitrite: NEGATIVE
Protein, ur: NEGATIVE mg/dL
Specific Gravity, Urine: 1.01 (ref 1.005–1.030)
pH: 6.5 (ref 5.0–8.0)

## 2023-09-16 LAB — LIPASE, BLOOD: Lipase: 66 U/L — ABNORMAL HIGH (ref 11–51)

## 2023-09-16 MED ORDER — FAMOTIDINE 20 MG PO TABS
20.0000 mg | ORAL_TABLET | Freq: Once | ORAL | Status: AC
  Administered 2023-09-16: 20 mg via ORAL
  Filled 2023-09-16: qty 1

## 2023-09-16 MED ORDER — PANTOPRAZOLE SODIUM 20 MG PO TBEC
20.0000 mg | DELAYED_RELEASE_TABLET | Freq: Every day | ORAL | 1 refills | Status: DC
  Filled 2023-09-16: qty 30, 30d supply, fill #0

## 2023-09-16 MED ORDER — DICYCLOMINE HCL 20 MG PO TABS
20.0000 mg | ORAL_TABLET | Freq: Two times a day (BID) | ORAL | 0 refills | Status: DC | PRN
  Filled 2023-09-16: qty 30, 15d supply, fill #0

## 2023-09-16 MED ORDER — ONDANSETRON 4 MG PO TBDP
4.0000 mg | ORAL_TABLET | Freq: Three times a day (TID) | ORAL | 0 refills | Status: DC | PRN
  Filled 2023-09-16: qty 20, 7d supply, fill #0

## 2023-09-16 MED ORDER — ALUM & MAG HYDROXIDE-SIMETH 200-200-20 MG/5ML PO SUSP
30.0000 mL | Freq: Once | ORAL | Status: AC
  Administered 2023-09-16: 30 mL via ORAL
  Filled 2023-09-16: qty 30

## 2023-09-16 MED ORDER — ONDANSETRON 4 MG PO TBDP: 8.0000 mg | ORAL_TABLET | Freq: Once | ORAL | Status: DC

## 2023-09-16 MED ORDER — ACETAMINOPHEN 500 MG PO TABS
1000.0000 mg | ORAL_TABLET | Freq: Once | ORAL | Status: AC
  Administered 2023-09-16: 1000 mg via ORAL
  Filled 2023-09-16: qty 2

## 2023-09-16 MED ORDER — DICYCLOMINE HCL 20 MG PO TABS: 20.0000 mg | ORAL_TABLET | Freq: Two times a day (BID) | ORAL | 0 refills | Status: DC | PRN

## 2023-09-16 MED ORDER — ONDANSETRON HCL 4 MG/2ML IJ SOLN
4.0000 mg | Freq: Once | INTRAMUSCULAR | Status: AC
  Administered 2023-09-16: 4 mg via INTRAVENOUS
  Filled 2023-09-16: qty 2

## 2023-09-16 MED ORDER — SUCRALFATE 1 G PO TABS
1.0000 g | ORAL_TABLET | Freq: Three times a day (TID) | ORAL | 0 refills | Status: DC
  Filled 2023-09-16: qty 117, 29d supply, fill #0
  Filled 2023-09-16: qty 3, 1d supply, fill #0
  Filled 2023-09-16: qty 11, 3d supply, fill #0

## 2023-09-16 MED ORDER — ONDANSETRON 4 MG PO TBDP: 4.0000 mg | ORAL_TABLET | Freq: Three times a day (TID) | ORAL | 0 refills | Status: DC | PRN

## 2023-09-16 MED ORDER — DICYCLOMINE HCL 10 MG PO CAPS
10.0000 mg | ORAL_CAPSULE | Freq: Once | ORAL | Status: AC
Start: 2023-09-16 — End: 2023-09-16
  Administered 2023-09-16: 10 mg via ORAL
  Filled 2023-09-16: qty 1

## 2023-09-16 MED ORDER — SUCRALFATE 1 G PO TABS: 1.0000 g | ORAL_TABLET | Freq: Three times a day (TID) | ORAL | 0 refills | Status: AC

## 2023-09-16 MED ORDER — SODIUM CHLORIDE 0.9 % IV BOLUS
1000.0000 mL | Freq: Once | INTRAVENOUS | Status: AC
  Administered 2023-09-16: 1000 mL via INTRAVENOUS

## 2023-09-16 NOTE — ED Notes (Signed)
 Discharge paperwork given and verbally understood.

## 2023-09-16 NOTE — ED Notes (Signed)
 Pt aware of the need for a urine... Unable to currently provide the sample.Samantha KitchenMarland Cuevas

## 2023-09-16 NOTE — ED Notes (Signed)
 Pt placed in room,asked to undress and don gown.

## 2023-09-16 NOTE — ED Triage Notes (Signed)
 Epigastric pain, nausea since 0300.

## 2023-09-16 NOTE — Discharge Instructions (Addendum)
 As discussed, your workup today was overall reassuring.  Your labs appeared to be normal.  Will send you home with reflux medicine as well as continued use of Bentyl  for treatment of your symptoms.  Recommend follow-up with GI specialist in the outpatient setting for reassessment.  Please do not hesitate to return to the emergency department if the worrisome signs and symptoms we discussed become apparent.

## 2023-09-16 NOTE — ED Provider Notes (Signed)
 [place] EMERGENCY DEPARTMENT AT [facility] Provider Note   CSN: [id number] Arrival date & time: 09/16/23  1246     History  No chief complaint on file.   [name] is a 53 y.o. female.  HPI   53 year old female presents emergency department complaints of abdominal pain.  Patient reports pain in upper middle abdomen began around 3 AM this morning has been constant since onset.  Does report feelings of nausea with decreased p.o. intake at present symptoms again this morning.  Denies any vomiting, urinary symptoms, change in bowel habits.  Last bowel movement earlier this morning and regular per patient.  That she has a history of IBS and feels like it is flaring up.  He was seen in the ED March of this year with similar symptoms with reassuring workup.  States that her symptoms now feels similar to then.  Denies any exertional worsening of symptoms or current cough, shortness of breath.  Denies radiation of pain.  Past medical history significant for GERD, IBS, headache  Home Medications Prior to Admission medications   Medication Sig Start Date End Date Taking? Authorizing Provider  albuterol (PROVENTIL HFA;VENTOLIN HFA) 108 (90 Base) MCG/ACT inhaler  03/29/18   [provider]  albuterol (VENTOLIN HFA) 108 (90 Base) MCG/ACT inhaler 1-2 inhalations every 4-6 hours as needed for wheezing. Dispense spacer as needed. 03/29/18 03/29/19  [provider]  amitriptyline (ELAVIL) 50 MG tablet Take 50 mg by mouth at bedtime.    [provider]  azelastine  (ASTELIN ) 0.1 % nasal spray Place 2 sprays into both nostrils 2 (two) times daily. 01/20/20   Wilhelmenia Harada, Amy V, PA-C  ciprofloxacin  (CIPRO ) 500 MG tablet Take 1 tablet (500 mg total) by mouth every 12 (twelve) hours. 03/12/21   Darletta Ehrich, PA-C  clonazePAM  (KLONOPIN ) 0.5 MG tablet Take 0.5 mg by mouth daily as needed for anxiety.  03/01/18   [provider]  cyclobenzaprine  (FLEXERIL ) 10 MG  tablet Take 1 tablet (10 mg total) by mouth 3 (three) times daily as needed for muscle spasms. 03/19/18   Nannette Babe, MD  desloratadine (CLARINEX) 5 MG tablet Take 1 tablet by mouth daily. 11/27/20   [provider]  diclofenac  sodium (VOLTAREN ) 1 % GEL Apply 2 g topically 4 (four) times daily. Rub into affected area of foot 2 to 4 times daily Patient not taking: Reported on 11/01/2020 06/29/18   Charity Conch, DPM  dicyclomine  (BENTYL ) 20 MG tablet Take 1 tablet (20 mg total) by mouth 2 (two) times daily for 3 days. 07/24/23 07/27/23  Schutt, Coni Deep, PA-C  escitalopram (LEXAPRO) 10 MG tablet Take 10 mg by mouth daily.    [provider]  fluticasone  (FLONASE ) 50 MCG/ACT nasal spray Place 1-2 sprays into both nostrils daily. 01/02/20   Wieters, Hallie C, PA-C  gabapentin  (NEURONTIN ) 300 MG capsule Take 1 capsule (300 mg total) by mouth 3 (three) times daily as needed for up to 21 doses. 08/16/22   Arvilla Birmingham, MD  ibuprofen  (ADVIL ) 600 MG tablet Take 1 tablet (600 mg total) by mouth every 8 (eight) hours as needed for up to 21 doses for moderate pain. 08/16/22   Arvilla Birmingham, MD  LINZESS 145 MCG CAPS capsule Take 145 mcg by mouth every morning. 06/09/18   [provider]  loperamide  (IMODIUM ) 2 MG capsule Take 1 capsule (2 mg total) by mouth 4 (four) times daily as needed for diarrhea or loose stools. 01/13/23  Edson Graces, MD  loratadine  (CLARITIN ) 10 MG tablet Take 1 tablet (10 mg total) by mouth daily. Patient not taking: Reported on 03/12/2021 08/31/19   Hall-Potvin, [place], PA-C  methylPREDNISolone  (MEDROL  DOSEPAK) 4 MG TBPK tablet Take as directed on package 08/17/22   Arvilla Birmingham, MD  metroNIDAZOLE  (FLAGYL ) 500 MG tablet Take 1 tablet (500 mg total) by mouth 2 (two) times daily. 03/12/21   Darletta Ehrich, PA-C  Multiple Vitamin (MULTIVITAMIN WITH MINERALS) TABS Take 1 tablet by mouth at bedtime.     [provider]  omeprazole  (PRILOSEC) 10 MG capsule Take 10 mg by mouth daily. Patient not taking: Reported on 05/27/2023    [provider]  ondansetron  (ZOFRAN -ODT) 4 MG disintegrating tablet Take 1 tablet (4 mg total) by mouth every 8 (eight) hours as needed for nausea or vomiting. Patient not taking: Reported on 05/27/2023 01/13/23   Edson Graces, MD  zolpidem (AMBIEN) 10 MG tablet Take 10 mg by mouth at bedtime.     [provider]  promethazine  (PHENERGAN ) 25 MG suppository Place 1 suppository (25 mg total) rectally every 6 (six) hours as needed for nausea. Patient not taking: Reported on 03/17/2018 08/04/11 08/31/19  Janita Mellow, MD  ranitidine (ZANTAC) 150 MG tablet Take 150 mg by mouth 2 (two) times daily.  08/31/19  [provider]      Allergies    Hydrocodone -acetaminophen , Vicodin [hydrocodone -acetaminophen ], and Penicillins    Review of Systems   Review of Systems  All other systems reviewed and are negative.   Physical Exam Updated Vital Signs BP (!) 136/92   Pulse 79   Temp 97.9 F (36.6 C)   Resp 16   Wt 79.8 kg   LMP 02/14/2018   SpO2 99%   BMI 30.21 kg/m  Physical Exam Vitals and nursing note reviewed.  Constitutional:      General: She is not in acute distress.    Appearance: She is well-developed.  HENT:     Head: Normocephalic and atraumatic.  Eyes:     Conjunctiva/sclera: Conjunctivae normal.  Cardiovascular:     Rate and Rhythm: Normal rate and regular rhythm.     Heart sounds: No murmur heard. Pulmonary:     Effort: Pulmonary effort is normal. No respiratory distress.     Breath sounds: Normal breath sounds.  Abdominal:     Palpations: Abdomen is soft.     Tenderness: There is abdominal tenderness in the epigastric area.  Musculoskeletal:        General: No swelling.     Cervical back: Neck supple.  Skin:    General: Skin is warm and dry.     Capillary Refill: Capillary refill takes less than 2 seconds.  Neurological:     Mental Status: She  is alert.  Psychiatric:        Mood and Affect: Mood normal.     ED Results / Procedures / Treatments   Labs (all labs ordered are listed, but only abnormal results are displayed) Labs Reviewed  CBC  COMPREHENSIVE METABOLIC PANEL WITH GFR  LIPASE, BLOOD  PREGNANCY, URINE  URINALYSIS, ROUTINE W REFLEX MICROSCOPIC    EKG None  Radiology No results found.  Procedures Procedures    Medications Ordered in ED Medications  alum & mag hydroxide-simeth (MAALOX/MYLANTA) 200-200-20 MG/5ML suspension 30 mL (30 mLs Oral Given 09/16/23 1518)  famotidine  (PEPCID ) tablet 20 mg (20 mg Oral Given 09/16/23 1518)  dicyclomine  (BENTYL ) capsule 10 mg (10 mg  Oral Given 09/16/23 1518)    ED Course/ Medical Decision Making/ A&P                                 Medical Decision Making Amount and/or Complexity of Data Reviewed Labs: ordered.  Risk OTC drugs. Prescription drug management.   This patient presents to the ED for concern of epigastric abdominal pain, this involves an extensive number of treatment options, and is a complaint that carries with it a high risk of complications and morbidity.  The differential diagnosis includes extremities, PUD, cholecystitis, CBD pathology, SBO/LBO, volvulus, diverticulitis, appendicitis, IBS, IBD, other   Co morbidities that complicate the patient evaluation  See HPI   Additional history obtained:  Additional history obtained from EMR External records from outside source obtained and reviewed including hospital records   Lab Tests:  I Ordered, and personally interpreted labs.  The pertinent results include: No leukocytosis.  No evidence of anemia.  Platelets within range.  No electrolyte abnormalities.  Patient with baseline renal function creatinine 1.05.  No transaminitis.  UA without abnormality.  Urine pregnancy negative.    Imaging Studies ordered:  N/a   Cardiac Monitoring: / EKG:  The patient was maintained on a cardiac  monitor.  I personally viewed and interpreted the cardiac monitored which showed an underlying rhythm of: sinus rhythm   Consultations Obtained:  N/a   Problem List / ED Course / Critical interventions / Medication management  Epigastric abdominal pain I ordered medication including Maalox, Pepcid , Bentyl , Zofran    Reevaluation of the patient after these medicines showed that the patient improved I have reviewed the patients home medicines and have made adjustments as needed   Social Determinants of Health:  Denies tobacco, licit drug use.   Test / Admission - Considered:  Epigastric abdominal pain Vitals signs within normal range and stable throughout visit. Laboratory studies significant for: See above 53 year old female presents emergency department complaints of abdominal pain.  Patient reports pain in upper middle abdomen began around 3 AM this morning has been constant since onset.  Does report feelings of nausea with decreased p.o. intake at present symptoms again this morning.  Denies any vomiting, urinary symptoms, change in bowel habits.  Denies any exertional worsening of symptoms or current cough, shortness of breath.  Denies radiation of pain. Last bowel movement earlier this morning and regular per patient.  That she has a history of IBS and feels like it is flaring up.  He was seen in the ED March of this year with similar symptoms with reassuring workup.  States that her symptoms now feels similar to then. On exam, reproducible tenderness to epigastric region.  Laboratory studies reassuring without evidence of acute emergent process.  Patient treated with GI cocktail as well as Bentyl /Zofran , fluids with improvement of symptoms and intolerance of p.o. suspect the patient's symptoms likely secondary to GERD versus gastritis.  Will restart patient on PPI and have her follow-up with PCP/GI in the outpatient setting.  Treatment plan discussed with patient and she acknowledged  understanding was agreeable to said plan.  Patient overall well-appearing, afebrile in no acute distress. Worrisome signs and symptoms were discussed with the patient, and the patient acknowledged understanding to return to the ED if noticed. Patient was stable upon discharge.          Final Clinical Impression(s) / ED Diagnoses Final diagnoses:  None    Rx / DC  Orders ED Discharge Orders     None         Nowthen Butter, [place] 09/16/23 1630    Guadalupe Lee, MD 09/22/23 1501

## 2023-10-09 ENCOUNTER — Ambulatory Visit: Payer: PRIVATE HEALTH INSURANCE

## 2023-10-12 ENCOUNTER — Ambulatory Visit
Admission: RE | Admit: 2023-10-12 | Discharge: 2023-10-12 | Disposition: A | Discharge status: Home / Self Care | Payer: PRIVATE HEALTH INSURANCE | Source: Ambulatory Visit | Attending: Internal Medicine | Admitting: Internal Medicine

## 2023-10-12 ENCOUNTER — Ambulatory Visit: Payer: PRIVATE HEALTH INSURANCE

## 2023-10-12 DIAGNOSIS — Z1231 Encounter for screening mammogram for malignant neoplasm of breast: Secondary | ICD-10-CM

## 2023-10-13 ENCOUNTER — Ambulatory Visit: Payer: PRIVATE HEALTH INSURANCE

## 2023-10-13 ENCOUNTER — Other Ambulatory Visit (HOSPITAL_COMMUNITY)
Admission: RE | Admit: 2023-10-13 | Discharge: 2023-10-13 | Disposition: A | Discharge status: Home / Self Care | Payer: PRIVATE HEALTH INSURANCE | Source: Ambulatory Visit | Attending: Physician Assistant | Admitting: Physician Assistant

## 2023-10-13 VITALS — BP 132/86 | HR 86

## 2023-10-13 DIAGNOSIS — N898 Other specified noninflammatory disorders of vagina: Secondary | ICD-10-CM

## 2023-10-13 LAB — POCT URINALYSIS DIPSTICK
Bilirubin, UA: NEGATIVE
Blood, UA: NEGATIVE
Glucose, UA: NEGATIVE
Ketones, UA: NEGATIVE
Leukocytes, UA: NEGATIVE
Nitrite, UA: NEGATIVE
Protein, UA: NEGATIVE
Spec Grav, UA: 1.015 (ref 1.010–1.025)
Urobilinogen, UA: 0.2 U/dL
pH, UA: 6 (ref 5.0–8.0)

## 2023-10-13 NOTE — Progress Notes (Addendum)
 SUBJECTIVE:  53 y.o. female complains of creamy vaginal discharge and itching for 2 month(s). Denies abnormal vaginal bleeding or significant pelvic pain or fever. C/O malodorous urine. Denies history of known exposure to STD.  Patient's last menstrual period was 02/14/2018.  OBJECTIVE:  She appears well, afebrile. Urine dipstick: negative for all components.  ASSESSMENT:  Vaginal Discharge  Vaginal Itching   PLAN:  GC, chlamydia, trichomonas, BVAG, CVAG probe, as well as urine culture sent to lab. Treatment: To be determined once lab results are received ROV prn if symptoms persist or worsen. Blood STI testing declined.

## 2023-10-14 ENCOUNTER — Other Ambulatory Visit: Payer: Self-pay

## 2023-10-14 LAB — CERVICOVAGINAL ANCILLARY ONLY
Bacterial Vaginitis (gardnerella): NEGATIVE
Candida Glabrata: NEGATIVE
Candida Vaginitis: POSITIVE — AB
Chlamydia: NEGATIVE
Comment: NEGATIVE
Comment: NEGATIVE
Comment: NEGATIVE
Comment: NEGATIVE
Comment: NEGATIVE
Comment: NORMAL
Neisseria Gonorrhea: NEGATIVE
Trichomonas: NEGATIVE

## 2023-10-14 MED ORDER — FLUCONAZOLE 150 MG PO TABS
150.0000 mg | ORAL_TABLET | Freq: Once | ORAL | 0 refills | Status: AC
Start: 2023-10-14 — End: 2023-10-14

## 2023-10-15 ENCOUNTER — Ambulatory Visit: Payer: Self-pay | Admitting: Obstetrics and Gynecology

## 2023-10-15 ENCOUNTER — Other Ambulatory Visit: Payer: Self-pay | Admitting: Obstetrics and Gynecology

## 2023-10-15 LAB — URINE CULTURE

## 2023-10-16 ENCOUNTER — Other Ambulatory Visit: Payer: Self-pay | Admitting: Obstetrics and Gynecology

## 2023-10-16 DIAGNOSIS — N3000 Acute cystitis without hematuria: Secondary | ICD-10-CM

## 2023-10-16 MED ORDER — NITROFURANTOIN MONOHYD MACRO 100 MG PO CAPS: 100.0000 mg | ORAL_CAPSULE | Freq: Two times a day (BID) | ORAL | 0 refills | Status: AC

## 2023-11-01 NOTE — Progress Notes (Signed)
   ANNUAL EXAM Patient name: [name] MRN [id number]  Date of birth: December 25, 1970 Chief Complaint:   No chief complaint on file.  History of Present Illness:   [name] is a 53 y.o. [phone] female being seen today for a routine annual exam.   Patient's last menstrual period was 02/14/2018.  Last pap 11/29/2021-NILM HR HPV negative, 11/01/2020-NILM HR HPV negative, 03/09/19-NILM HR HPV negative, 02/10/18-NILM HR HPV negative. -H/O abnormal pap: no Last mammogram: 10/12/2023. Results were: normal. Family h/o breast cancer: no Last colonoscopy: 2019-2020. Results were: normal. Family h/o colorectal cancer: no. Sigmoid endoscopy ordered.  STI screening: Declines Contraception: Tubal ligation 2006      No data to display               No data to display           Review of Systems:   Pertinent items are noted in HPI Denies any headaches, blurred vision, fatigue, shortness of breath, chest pain, abdominal pain, abnormal vaginal discharge/itching/odor/irritation, problems with periods, bowel movements, urination, or intercourse unless otherwise stated above. Pertinent History Reviewed:  Reviewed past medical,surgical, social and family history.  Reviewed problem list, medications and allergies. Physical Assessment:  There were no vitals filed for this visit.There is no height or weight on file to calculate BMI.        Physical Examination:   General appearance - well appearing, and in no distress  Mental status - alert, oriented to person, place, and time  Psych:  She has a normal mood and affect  Skin - warm and dry, normal color, no suspicious lesions noted  Chest - effort normal, all lung fields clear to auscultation bilaterally  Heart - normal rate and regular rhythm  Neck:  midline trachea, no thyromegaly or nodules  Breasts - breasts appear normal, no suspicious masses, no skin or nipple changes or  axillary nodes  Abdomen - soft, nontender, nondistended, no  masses or organomegaly  Pelvic - VULVA: normal appearing vulva with no masses, tenderness or lesions  VAGINA: normal appearing vagina with normal color and discharge, no lesions  CERVIX: normal appearing cervix without discharge or lesions, no CMT  UTERUS: uterus is felt to be normal size, shape, consistency and nontender   ADNEXA: No adnexal masses or tenderness noted.  Extremities:  No swelling or varicosities noted  Chaperone present for exam  No results found for this or any previous visit (from the past 24 hours).  Assessment & Plan:  1. Encounter for annual routine gynecological examination (Primary) 2. Cervical cancer screening 3. Breast cancer screening by mammogram 4. Screening for colon cancer  - Cervical cancer screening: Discussed guidelines. Pap with HPV due 2028.  - Breast Health: Encouraged self breast awareness/SBE. Discussed limits of clinical breast exam for detecting breast cancer. Discussed importance of annual MXR. MXR is up to date: 10/12/23 - Climacteric/Sexual health: Reviewed typical and atypical symptoms of menopause/peri-menopause. Discussed PMB and to call if any amount of spotting.  - Colonoscopy: Sigmoid endoscopy ordered - F/U 12 months and prn    No orders of the defined types were placed in this encounter.   Meds: No orders of the defined types were placed in this encounter.   Follow-up: No follow-ups on file.  Sameera Betton E Ramelo Oetken, PA-C 11/01/2023 3:00 PM

## 2023-11-05 ENCOUNTER — Encounter: Payer: Self-pay | Admitting: Physician Assistant

## 2023-11-05 ENCOUNTER — Ambulatory Visit (INDEPENDENT_AMBULATORY_CARE_PROVIDER_SITE_OTHER): Payer: PRIVATE HEALTH INSURANCE | Admitting: Physician Assistant

## 2023-11-05 VITALS — BP 137/89 | HR 69 | Ht 64.0 in | Wt 176.1 lb

## 2023-11-05 DIAGNOSIS — Z1211 Encounter for screening for malignant neoplasm of colon: Secondary | ICD-10-CM | POA: Diagnosis not present

## 2023-11-05 DIAGNOSIS — Z1231 Encounter for screening mammogram for malignant neoplasm of breast: Secondary | ICD-10-CM

## 2023-11-05 DIAGNOSIS — Z124 Encounter for screening for malignant neoplasm of cervix: Secondary | ICD-10-CM | POA: Diagnosis not present

## 2023-11-05 DIAGNOSIS — Z01419 Encounter for gynecological examination (general) (routine) without abnormal findings: Secondary | ICD-10-CM

## 2023-11-05 DIAGNOSIS — N951 Menopausal and female climacteric states: Secondary | ICD-10-CM

## 2023-11-05 NOTE — Progress Notes (Signed)
 Pt presents for annual exam. Declines all STI testing. Would like to discuss perimenopause.

## 2023-11-08 ENCOUNTER — Encounter: Payer: Self-pay | Admitting: Physician Assistant

## 2023-11-25 ENCOUNTER — Emergency Department (HOSPITAL_BASED_OUTPATIENT_CLINIC_OR_DEPARTMENT_OTHER)
Admission: EM | Admit: 2023-11-25 | Discharge: 2023-11-25 | Disposition: A | Discharge status: Home / Self Care | Payer: PRIVATE HEALTH INSURANCE | Attending: Emergency Medicine | Admitting: Emergency Medicine

## 2023-11-25 ENCOUNTER — Encounter (HOSPITAL_BASED_OUTPATIENT_CLINIC_OR_DEPARTMENT_OTHER): Payer: Self-pay | Admitting: Emergency Medicine

## 2023-11-25 ENCOUNTER — Emergency Department (HOSPITAL_BASED_OUTPATIENT_CLINIC_OR_DEPARTMENT_OTHER): Payer: PRIVATE HEALTH INSURANCE

## 2023-11-25 ENCOUNTER — Other Ambulatory Visit: Payer: Self-pay

## 2023-11-25 DIAGNOSIS — M25562 Pain in left knee: Secondary | ICD-10-CM | POA: Diagnosis present

## 2023-11-25 LAB — D-DIMER, QUANTITATIVE: D-Dimer, Quant: 0.37 ug{FEU}/mL (ref 0.00–0.50)

## 2023-11-25 MED ORDER — ACETAMINOPHEN 500 MG PO TABS
1000.0000 mg | ORAL_TABLET | Freq: Once | ORAL | Status: AC
  Administered 2023-11-25: 1000 mg via ORAL
  Filled 2023-11-25: qty 2

## 2023-11-25 NOTE — ED Provider Notes (Signed)
 [place] EMERGENCY DEPARTMENT AT [facility] Provider Note   CSN: [id number] Arrival date & time: 11/25/23  9062     Patient presents with: Knee Pain   [name] is a 53 y.o. female with no significant past medical history presents with concern for sudden onset of left knee and calf pain that started 1 week ago.  States the pain is mostly behind her left knee and goes into the upper calf.  Denies any injury to the area.  Denies any numbness or tingling in the lower extremity.  Reports she was diagnosed with a loss of blood flow on her x-ray by urgent care and she came here for an MRI.  Denies any personal history of a blood clot, any recent long plane or car rides, any recent surgeries or hospitalizations, any hormonal medication use.     Knee Pain      Prior to Admission medications   Medication Sig Start Date End Date Taking? Authorizing Provider  meloxicam (MOBIC) 15 MG tablet Take 15 mg by mouth daily. 11/22/23  Yes [provider]  albuterol (PROVENTIL HFA;VENTOLIN HFA) 108 (90 Base) MCG/ACT inhaler  03/29/18   [provider]  amitriptyline (ELAVIL) 50 MG tablet Take 50 mg by mouth at bedtime.    [provider]  azelastine  (ASTELIN ) 0.1 % nasal spray Place 2 sprays into both nostrils 2 (two) times daily. Patient not taking: Reported on 11/05/2023 01/20/20   Babara Greig GAILS, PA-C  cetirizine (ZYRTEC) 10 MG chewable tablet Chew 10 mg by mouth daily.    [provider]  ciprofloxacin  (CIPRO ) 500 MG tablet Take 1 tablet (500 mg total) by mouth every 12 (twelve) hours. 03/12/21   Theotis Peers M, PA-C  clonazePAM  (KLONOPIN ) 0.5 MG tablet Take 0.5 mg by mouth daily as needed for anxiety.  03/01/18   [provider]  cyclobenzaprine  (FLEXERIL ) 10 MG tablet Take 1 tablet (10 mg total) by mouth 3 (three) times daily as needed for muscle spasms. Patient not taking: Reported on 11/05/2023 03/19/18   Baxter Drivers, MD  diclofenac  sodium  (VOLTAREN ) 1 % GEL Apply 2 g topically 4 (four) times daily. Rub into affected area of foot 2 to 4 times daily Patient not taking: Reported on 11/05/2023 06/29/18   Gershon Donnice SAUNDERS, DPM  dicyclomine  (BENTYL ) 20 MG tablet Take 1 tablet (20 mg total) by mouth 2 (two) times daily for 3 days. Patient not taking: Reported on 11/05/2023 07/24/23 07/27/23  Edwardo Marsa HERO, PA-C  dicyclomine  (BENTYL ) 20 MG tablet Take 1 tablet (20 mg total) by mouth 2 (two) times daily as needed. Patient not taking: Reported on 11/05/2023 09/16/23   Silver Fell A, PA  escitalopram (LEXAPRO) 10 MG tablet Take 10 mg by mouth daily.    [provider]  fluticasone  (FLONASE ) 50 MCG/ACT nasal spray Place 1-2 sprays into both nostrils daily. Patient not taking: Reported on 11/05/2023 01/02/20   Wieters, Hallie C, PA-C  gabapentin  (NEURONTIN ) 300 MG capsule Take 1 capsule (300 mg total) by mouth 3 (three) times daily as needed for up to 21 doses. Patient not taking: Reported on 11/05/2023 08/16/22   Cottie Donnice PARAS, MD  ibuprofen  (ADVIL ) 600 MG tablet Take 1 tablet (600 mg total) by mouth every 8 (eight) hours as needed for up to 21 doses for moderate pain. Patient not taking: Reported on 11/05/2023 08/16/22   Cottie Donnice PARAS, MD  LINZESS 145 MCG CAPS capsule Take 145 mcg by mouth every morning.  06/09/18   [provider]  loperamide  (IMODIUM ) 2 MG capsule Take 1 capsule (2 mg total) by mouth 4 (four) times daily as needed for diarrhea or loose stools. Patient not taking: Reported on 11/05/2023 01/13/23   Theadore Ozell HERO, MD  loratadine  (CLARITIN ) 10 MG tablet Take 1 tablet (10 mg total) by mouth daily. Patient not taking: Reported on 11/05/2023 08/31/19   Hall-Potvin, [place], PA-C  methylPREDNISolone  (MEDROL  DOSEPAK) 4 MG TBPK tablet Take as directed on package Patient not taking: Reported on 11/05/2023 08/17/22   Cottie Donnice PARAS, MD  metroNIDAZOLE  (FLAGYL ) 500 MG tablet Take 1 tablet (500 mg total) by mouth 2 (two)  times daily. Patient not taking: Reported on 11/05/2023 03/12/21   Theotis Cameron HERO, PA-C  Multiple Vitamin (MULTIVITAMIN WITH MINERALS) TABS Take 1 tablet by mouth at bedtime.     [provider]  ondansetron  (ZOFRAN -ODT) 4 MG disintegrating tablet Dissolve 1 tablet under the tongue every 8 (eight) hours as needed. Patient not taking: Reported on 11/05/2023 09/16/23   Silver Fell A, PA  sucralfate  (CARAFATE ) 1 g tablet Take 1 tablet (1 g total) by mouth 4 (four) times daily -  with meals and at bedtime. Patient not taking: Reported on 11/05/2023 09/16/23 10/16/23  Silver Fell A, PA  zolpidem (AMBIEN) 10 MG tablet Take 10 mg by mouth at bedtime.     [provider]  promethazine  (PHENERGAN ) 25 MG suppository Place 1 suppository (25 mg total) rectally every 6 (six) hours as needed for nausea. Patient not taking: Reported on 03/17/2018 08/04/11 08/31/19  Jesus Oliphant, MD  ranitidine (ZANTAC) 150 MG tablet Take 150 mg by mouth 2 (two) times daily.  08/31/19  [provider]    Allergies: Hydrocodone -acetaminophen , Vicodin [hydrocodone -acetaminophen ], and Penicillins    Review of Systems  Musculoskeletal:        Left knee pain    Updated Vital Signs BP 126/86 (BP Location: Right Arm)   Pulse 74   Temp 99 F (37.2 C) (Oral)   Resp 16   Ht 5' 4 (1.626 m)   Wt 79.4 kg   LMP 02/14/2018   SpO2 97%   BMI 30.04 kg/m   Physical Exam Vitals and nursing note reviewed.  Constitutional:      Appearance: Normal appearance.  HENT:     Head: Atraumatic.  Cardiovascular:     Rate and Rhythm: Normal rate and regular rhythm.     Comments: Pedal pulses 2+ bilaterally Pulmonary:     Effort: Pulmonary effort is normal.  Musculoskeletal:     Comments: Left lower extremity:  General No obvious deformity. No erythema, edema, contusions, open wounds   Palpation Tender over the popliteal fossa and upper portion of the left calf  Non tender over the femur Nontender along  the tibia and fibula, patella, MCL, LCL Nontender on the patellar and quadriceps tendon   ROM Full knee flexion and extension, but with some pain. Able to ambulate, but with some pain  Special tests No ligamentous laxity with Lachman's or posterior drawer testing.  No ligamentous laxity with valgus or varus stress of the knee  Sensation: Sensation intact throughout the lower extremity  Strength: 5/5 strength with resisted knee flexion and extension  5/5 strength with resisted ankle plantarflexion and dorsiflexion   Neurological:     General: No focal deficit present.     Mental Status: She is alert.  Psychiatric:        Mood and Affect: Mood normal.  Behavior: Behavior normal.     (all labs ordered are listed, but only abnormal results are displayed) Labs Reviewed  D-DIMER, QUANTITATIVE    EKG: None  Radiology: DG Knee Complete 4 Views Left Result Date: 11/25/2023 CLINICAL DATA:  Left knee pain. EXAM: LEFT KNEE - COMPLETE 4+ VIEW COMPARISON:  None Available. FINDINGS: No acute fracture or dislocation. The bones are well mineralized. Mild narrowing of the medial compartment. Sclerotic changes of the distal femur, likely a chondroid lesion or secondary to avascular necrosis. No cortical breakage or other aggressive features. Direct comparison with prior images, if available, recommended. If no prior images are available, orthopedic referral is advised. No joint effusion. The soft tissues are unremarkable. IMPRESSION: 1. No acute fracture or dislocation. 2. Mild narrowing of the medial compartment. 3. Sclerotic changes of the distal femur with no aggressive features. Electronically Signed   By: Vanetta Chou M.D.   On: 11/25/2023 10:30     Procedures   Medications Ordered in the ED  acetaminophen  (TYLENOL ) tablet 1,000 mg (1,000 mg Oral Given 11/25/23 1044)                                    Medical Decision Making Amount and/or Complexity of Data  Reviewed Labs: ordered. Radiology: ordered.  Risk OTC drugs.     Differential diagnosis includes but is not limited to fracture, dislocation, AVN, DVT, sprain, meniscal injury, baker's cyst  ED Course:  Upon initial evaluation, patient is well-appearing, stable vitals.  Reporting pain behind the left knee and to the left calf that started about a week ago.  No known injury.  She does not have any bony tenderness palpation of the femur, tibia or fibula.  Does not have any ligamentous laxity on exam.  She is tender of the popliteal fossa into the upper left calf.  No appreciable edema on exam or overlying skin changes.  She has full range of motion of the left knee and is able to walk, but with some pain.  Neurovascular intact in the left lower extremity. Patient requests MRI today which I explained I do not have MRI here today, and she likely does not need one emergently.  Recommended we start with x-ray to ensure no worsening of the finding that was there on urgent care's x-ray.  I am not able to see urgent care x-rays.  Will also obtain D-dimer to evaluate for possible DVT.  Labs Ordered: I Ordered, and personally interpreted labs.  The pertinent results include:   D-dimer within normal limits  Imaging Studies ordered: I ordered imaging studies including x-ray left knee I independently visualized the imaging with scope of interpretation limited to determining acute life threatening conditions related to emergency care. Imaging showed  FINDINGS: No acute fracture or dislocation. The bones are well mineralized. Mild narrowing of the medial compartment. Sclerotic changes of the distal femur, likely a chondroid lesion or secondary to avascular necrosis. No cortical breakage or other aggressive features. Direct comparison with prior images, if available, recommended. If no prior images are available, orthopedic referral is advised. No joint effusion. The soft tissues are unremarkable.    IMPRESSION: 1. No acute fracture or dislocation. 2. Mild narrowing of the medial compartment. 3. Sclerotic changes of the distal femur with no aggressive features. I agree with the radiologist interpretation   Medications Given: Tylenol   Upon re-evaluation, patient remains well-appearing with stable vitals.  We discussed  that her x-ray does not seem significantly different than x-rays taken at urgent care.  There is still a the sclerotic defect noted to the distal femur, radiology feels that this is likely a chondroid lesion versus avascular necrosis.  No cortical breakage or other aggressive features.  No fracture or dislocation.  She is neurovascular intact in the left lower extremity.  No signs of infection such as erythema or wounds.  Discussed that she will need to follow-up with orthopedics regarding this finding and for further management.  D-dimer within normal limits, low concern for DVT at this time.  Stable and appropriate for discharge home    Impression: Left knee pain  Disposition:  The patient was discharged home with instructions to follow-up with Dr. Selinda Gosling with orthopedics within the next week.  His contact information was provided.  Patient understands she needs to call his office to schedule an appointment.  May take Tylenol  and ibuprofen  as needed for pain. Return precautions given.     This chart was dictated using voice recognition software, Dragon. Despite the best efforts of this provider to proofread and correct errors, errors may still occur which can change documentation meaning.      Final diagnoses:  Acute pain of left knee    ED Discharge Orders     None          Veta Palma, PA-C 11/25/23 1228    Emil Share, DO 11/25/23 1312

## 2023-11-25 NOTE — ED Notes (Signed)
 DC paperwork given and verbally understood.

## 2023-11-25 NOTE — ED Triage Notes (Signed)
 Pt caox4, ambulatory c/o L knee pain x1 wk. 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 Denies injury. Last had Ibuprofen  and meloxicam at 0815.

## 2023-11-25 NOTE — Discharge Instructions (Addendum)
 It is unclear as to the exact cause of your pain.  On your x-ray we do see some changes in your femur bone (thigh bone) which could be causing pain.  I have included your X-ray results below for your reference. Please contact the orthopedic office listed below to schedule an appointment within the next week for follow-up.  You may need additional imaging to further evaluate this area.  Based on your exam, you may also have had a Baker's cyst (fluid collection) behind the left knee which can cause pain.  If you had a cyst, this should improve gradually on its own.  You do not have any signs of a blood clot.  You may continue wearing your knee brace or wrap with an Ace bandage to help provide compression which can sometimes be helpful with pain.  You may take up to 1000mg  of tylenol  every 6 hours as needed for pain. Do not take more then 4g per day.   You may use up to 600mg  ibuprofen  every 6 hours as needed for pain.  Do not exceed 2.4g of ibuprofen  per day.  Do not take other NSAID medications with ibuprofen  such as meloxicam, Aleve/naproxen, BC powders, Advil .  Gradually return to activity as pain allows. Try to engage in non-painful types of physical activity/exercise to increase blood flow to your area of injury.   Please return to the emergency room for any numbness in your left leg, inability to walk, any other new or concerning symptoms   EXAM:  LEFT KNEE - COMPLETE 4+ VIEW    COMPARISON:  None Available.    FINDINGS:  No acute fracture or dislocation. The bones are well mineralized.  Mild narrowing of the medial compartment. Sclerotic changes of the  distal femur, likely a chondroid lesion or secondary to avascular  necrosis. No cortical breakage or other aggressive features. Direct  comparison with prior images, if available, recommended. If no prior  images are available, orthopedic referral is advised. No joint  effusion. The soft tissues are unremarkable.    IMPRESSION:  1.  No acute fracture or dislocation.  2. Mild narrowing of the medial compartment.  3. Sclerotic changes of the distal femur with no aggressive  features.      Electronically Signed    By: Vanetta Chou M.D.    On: 11/25/2023 10:30

## 2024-01-20 ENCOUNTER — Other Ambulatory Visit: Payer: Self-pay

## 2024-01-20 ENCOUNTER — Emergency Department (HOSPITAL_BASED_OUTPATIENT_CLINIC_OR_DEPARTMENT_OTHER)

## 2024-01-20 ENCOUNTER — Emergency Department (HOSPITAL_BASED_OUTPATIENT_CLINIC_OR_DEPARTMENT_OTHER)
Admission: EM | Admit: 2024-01-20 | Discharge: 2024-01-20 | Disposition: A | Discharge status: Home / Self Care | Attending: Emergency Medicine | Admitting: Emergency Medicine

## 2024-01-20 DIAGNOSIS — M26629 Arthralgia of temporomandibular joint, unspecified side: Secondary | ICD-10-CM

## 2024-01-20 DIAGNOSIS — R519 Headache, unspecified: Secondary | ICD-10-CM | POA: Diagnosis present

## 2024-01-20 DIAGNOSIS — M26623 Arthralgia of bilateral temporomandibular joint: Secondary | ICD-10-CM | POA: Diagnosis not present

## 2024-01-20 LAB — BASIC METABOLIC PANEL WITH GFR
Anion gap: 14 (ref 5–15)
BUN: 13 mg/dL (ref 6–20)
CO2: 19 mmol/L — ABNORMAL LOW (ref 22–32)
Calcium: 10.7 mg/dL — ABNORMAL HIGH (ref 8.9–10.3)
Chloride: 107 mmol/L (ref 98–111)
Creatinine, Ser: 1.16 mg/dL — ABNORMAL HIGH (ref 0.44–1.00)
GFR, Estimated: 56 mL/min — ABNORMAL LOW (ref >60)
Glucose, Bld: 155 mg/dL — ABNORMAL HIGH (ref 70–99)
Potassium: 3.9 mmol/L (ref 3.5–5.1)
Sodium: 140 mmol/L (ref 135–145)

## 2024-01-20 LAB — CBC WITH DIFFERENTIAL/PLATELET
Abs Immature Granulocytes: 0 K/uL (ref 0.00–0.07)
Basophils Absolute: 0 K/uL (ref 0.0–0.1)
Basophils Relative: 0 %
Eosinophils Absolute: 0.3 K/uL (ref 0.0–0.5)
Eosinophils Relative: 4 %
HCT: 38.5 % (ref 36.0–46.0)
Hemoglobin: 13.9 g/dL (ref 12.0–15.0)
Immature Granulocytes: 0 %
Lymphocytes Relative: 62 %
Lymphs Abs: 4.2 K/uL — ABNORMAL HIGH (ref 0.7–4.0)
MCH: 31.2 pg (ref 26.0–34.0)
MCHC: 36.1 g/dL — ABNORMAL HIGH (ref 30.0–36.0)
MCV: 86.5 fL (ref 80.0–100.0)
Monocytes Absolute: 0.6 K/uL (ref 0.1–1.0)
Monocytes Relative: 9 %
Neutro Abs: 1.7 K/uL (ref 1.7–7.7)
Neutrophils Relative %: 25 %
Platelets: 337 K/uL (ref 150–400)
RBC: 4.45 MIL/uL (ref 3.87–5.11)
RDW: 12 % (ref 11.5–15.5)
WBC: 6.8 K/uL (ref 4.0–10.5)
nRBC: 0 % (ref 0.0–0.2)

## 2024-01-20 MED ORDER — LABETALOL HCL 5 MG/ML IV SOLN
10.0000 mg | Freq: Once | INTRAVENOUS | Status: AC
  Administered 2024-01-20: 10 mg via INTRAVENOUS
  Filled 2024-01-20: qty 4

## 2024-01-20 MED ORDER — KETOROLAC TROMETHAMINE 15 MG/ML IJ SOLN
15.0000 mg | Freq: Once | INTRAMUSCULAR | Status: AC
  Administered 2024-01-20: 15 mg via INTRAVENOUS
  Filled 2024-01-20: qty 1

## 2024-01-20 MED ORDER — FENTANYL CITRATE PF 50 MCG/ML IJ SOSY
50.0000 ug | PREFILLED_SYRINGE | Freq: Once | INTRAMUSCULAR | Status: AC
  Administered 2024-01-20: 50 ug via INTRAVENOUS
  Filled 2024-01-20: qty 1

## 2024-01-20 MED ORDER — METHOCARBAMOL 500 MG PO TABS: 500.0000 mg | ORAL_TABLET | Freq: Every evening | ORAL | 0 refills | Status: AC | PRN

## 2024-01-20 MED ORDER — PROCHLORPERAZINE EDISYLATE 10 MG/2ML IJ SOLN
5.0000 mg | Freq: Once | INTRAMUSCULAR | Status: AC
  Administered 2024-01-20: 5 mg via INTRAVENOUS
  Filled 2024-01-20: qty 2

## 2024-01-20 MED ORDER — METHOCARBAMOL 500 MG PO TABS
1000.0000 mg | ORAL_TABLET | Freq: Once | ORAL | Status: AC
  Administered 2024-01-20: 1000 mg via ORAL
  Filled 2024-01-20: qty 2

## 2024-01-20 MED ORDER — ONDANSETRON HCL 4 MG/2ML IJ SOLN
4.0000 mg | Freq: Once | INTRAMUSCULAR | Status: AC
  Administered 2024-01-20: 4 mg via INTRAVENOUS
  Filled 2024-01-20: qty 2

## 2024-01-20 NOTE — ED Provider Notes (Signed)
 [place] EMERGENCY DEPARTMENT AT [facility] Provider Note  CSN: [id number] Arrival date & time: 01/20/24 0507  Chief Complaint(s) Headache  HPI [name] is a 53 y.o. female with a past medical history listed below including migraine headaches here for severe frontal headache that woke her up from sleep around 4 AM.  She denies any recent fevers or infections.  No cough or congestion.  No nausea or vomiting.  Reports that this is the worst headache ever.  No focal deficits.  Reports she was able to drive herself here.  No anticoagulation.  The history is provided by the patient.    Past Medical History Past Medical History:  Diagnosis Date   Bursitis    GERD (gastroesophageal reflux disease)    Headache(784.0)    IBS (irritable bowel syndrome)    Seasonal allergies    Patient Active Problem List   Diagnosis Date Noted   Herniated nucleus pulposus, cervical 05/16/2020   Cervical stenosis of spine 05/16/2020   Bunion 06/29/2018   Home Medication(s) Prior to Admission medications   Medication Sig Start Date End Date Taking? Authorizing Provider  methocarbamol  (ROBAXIN ) 500 MG tablet Take 1-2 tablets (500-1,000 mg total) by mouth at bedtime as needed for muscle spasms. 01/20/24  Yes Roosevelt Eimers, Raynell Moder, MD  albuterol (PROVENTIL HFA;VENTOLIN HFA) 108 (90 Base) MCG/ACT inhaler  03/29/18   [provider]  amitriptyline (ELAVIL) 50 MG tablet Take 50 mg by mouth at bedtime.    [provider]  azelastine  (ASTELIN ) 0.1 % nasal spray Place 2 sprays into both nostrils 2 (two) times daily. Patient not taking: Reported on 11/05/2023 01/20/20   Babara Greig GAILS, PA-C  cetirizine (ZYRTEC) 10 MG chewable tablet Chew 10 mg by mouth daily.    [provider]  ciprofloxacin  (CIPRO ) 500 MG tablet Take 1 tablet (500 mg total) by mouth every 12 (twelve) hours. 03/12/21   Theotis Peers M, PA-C  clonazePAM  (KLONOPIN ) 0.5 MG tablet Take 0.5 mg by mouth daily as  needed for anxiety.  03/01/18   [provider]  diclofenac  sodium (VOLTAREN ) 1 % GEL Apply 2 g topically 4 (four) times daily. Rub into affected area of foot 2 to 4 times daily Patient not taking: Reported on 11/05/2023 06/29/18   Gershon Donnice SAUNDERS, DPM  dicyclomine  (BENTYL ) 20 MG tablet Take 1 tablet (20 mg total) by mouth 2 (two) times daily for 3 days. Patient not taking: Reported on 11/05/2023 07/24/23 07/27/23  Edwardo Marsa HERO, PA-C  dicyclomine  (BENTYL ) 20 MG tablet Take 1 tablet (20 mg total) by mouth 2 (two) times daily as needed. Patient not taking: Reported on 11/05/2023 09/16/23   Silver Fell A, PA  escitalopram (LEXAPRO) 10 MG tablet Take 10 mg by mouth daily.    [provider]  fluticasone  (FLONASE ) 50 MCG/ACT nasal spray Place 1-2 sprays into both nostrils daily. Patient not taking: Reported on 11/05/2023 01/02/20   Wieters, Hallie C, PA-C  gabapentin  (NEURONTIN ) 300 MG capsule Take 1 capsule (300 mg total) by mouth 3 (three) times daily as needed for up to 21 doses. Patient not taking: Reported on 11/05/2023 08/16/22   Cottie Donnice PARAS, MD  ibuprofen  (ADVIL ) 600 MG tablet Take 1 tablet (600 mg total) by mouth every 8 (eight) hours as needed for up to 21 doses for moderate pain. Patient not taking: Reported on 11/05/2023 08/16/22   Cottie Donnice PARAS, MD  LINZESS 145 MCG CAPS capsule Take 145 mcg by mouth every morning. 06/09/18  [provider]  loperamide  (IMODIUM ) 2 MG capsule Take 1 capsule (2 mg total) by mouth 4 (four) times daily as needed for diarrhea or loose stools. Patient not taking: Reported on 11/05/2023 01/13/23   Theadore Ozell HERO, MD  loratadine  (CLARITIN ) 10 MG tablet Take 1 tablet (10 mg total) by mouth daily. Patient not taking: Reported on 11/05/2023 08/31/19   Hall-Potvin, [place], PA-C  meloxicam (MOBIC) 15 MG tablet Take 15 mg by mouth daily. 11/22/23   [provider]  methylPREDNISolone  (MEDROL  DOSEPAK) 4 MG TBPK tablet Take as directed  on package Patient not taking: Reported on 11/05/2023 08/17/22   Cottie Donnice PARAS, MD  metroNIDAZOLE  (FLAGYL ) 500 MG tablet Take 1 tablet (500 mg total) by mouth 2 (two) times daily. Patient not taking: Reported on 11/05/2023 03/12/21   Theotis Cameron HERO, PA-C  Multiple Vitamin (MULTIVITAMIN WITH MINERALS) TABS Take 1 tablet by mouth at bedtime.     [provider]  ondansetron  (ZOFRAN -ODT) 4 MG disintegrating tablet Dissolve 1 tablet under the tongue every 8 (eight) hours as needed. Patient not taking: Reported on 11/05/2023 09/16/23   Silver Fell A, PA  sucralfate  (CARAFATE ) 1 g tablet Take 1 tablet (1 g total) by mouth 4 (four) times daily -  with meals and at bedtime. Patient not taking: Reported on 11/05/2023 09/16/23 10/16/23  Silver Fell A, PA  zolpidem (AMBIEN) 10 MG tablet Take 10 mg by mouth at bedtime.     [provider]  promethazine  (PHENERGAN ) 25 MG suppository Place 1 suppository (25 mg total) rectally every 6 (six) hours as needed for nausea. Patient not taking: Reported on 03/17/2018 08/04/11 08/31/19  Jesus Oliphant, MD  ranitidine (ZANTAC) 150 MG tablet Take 150 mg by mouth 2 (two) times daily.  08/31/19  [provider]                                                                                                                                    Allergies Hydrocodone -acetaminophen , Vicodin [hydrocodone -acetaminophen ], and Penicillins  Review of Systems Review of Systems As noted in HPI  Physical Exam Vital Signs  I have reviewed the triage vital signs BP 107/77   Pulse 70   Temp 97.9 F (36.6 C) (Oral)   Resp 18   LMP 02/14/2018   SpO2 93%   Physical Exam Vitals reviewed.  Constitutional:      General: She is not in acute distress.    Appearance: She is well-developed. She is not diaphoretic.  HENT:     Head: Normocephalic and atraumatic.     Right Ear: External ear normal.     Left Ear: External ear normal.     Nose: Nose normal.   Eyes:     General: No scleral icterus.    Conjunctiva/sclera: Conjunctivae normal.  Neck:     Trachea: Phonation normal.  Cardiovascular:     Rate and Rhythm: Normal rate and  regular rhythm.  Pulmonary:     Effort: Pulmonary effort is normal. No respiratory distress.     Breath sounds: No stridor.  Abdominal:     General: There is no distension.  Musculoskeletal:        General: Normal range of motion.     Cervical back: Normal range of motion.  Neurological:     Mental Status: She is alert and oriented to person, place, and time.     Cranial Nerves: Cranial nerves 2-12 are intact.     Sensory: Sensation is intact.     Motor: Motor function is intact.     Coordination: Coordination is intact.  Psychiatric:        Behavior: Behavior normal.     ED Results and Treatments Labs (all labs ordered are listed, but only abnormal results are displayed) Labs Reviewed  CBC WITH DIFFERENTIAL/PLATELET - Abnormal; Notable for the following components:      Result Value   MCHC 36.1 (*)    Lymphs Abs 4.2 (*)    All other components within normal limits  BASIC METABOLIC PANEL WITH GFR - Abnormal; Notable for the following components:   CO2 19 (*)    Glucose, Bld 155 (*)    Creatinine, Ser 1.16 (*)    Calcium 10.7 (*)    GFR, Estimated 56 (*)    All other components within normal limits                                                                                                                         EKG  EKG Interpretation Date/Time:    Ventricular Rate:    PR Interval:    QRS Duration:    QT Interval:    QTC Calculation:   R Axis:      Text Interpretation:         Radiology CT Head Wo Contrast Result Date: 01/20/2024 EXAM: CT HEAD WITHOUT CONTRAST 01/20/2024 06:00:00 AM TECHNIQUE: CT of the head was performed without the administration of intravenous contrast. Automated exposure control, iterative reconstruction, and/or weight based adjustment of the mA/kV was  utilized to reduce the radiation dose to as low as reasonably achievable. COMPARISON: None available. CLINICAL HISTORY: Headache. Patient presents via POV complaining of headache. Reports history of migraines. Reports pain woke her up out of her sleep. Tearful during triage. Reports worst headache she has ever had. Alert and oriented times four. FINDINGS: BRAIN AND VENTRICLES: No acute hemorrhage. No evidence of acute infarct. No hydrocephalus. No extra-axial collection. No mass effect or midline shift. ORBITS: No acute abnormality. SINUSES: No acute abnormality. SOFT TISSUES AND SKULL: No acute soft tissue abnormality. No skull fracture. IMPRESSION: 1. No acute intracranial abnormality. Electronically signed by: Evalene Coho MD 01/20/2024 06:12 AM EDT RP Workstation: [id number]    Medications Ordered in ED Medications  fentaNYL  (SUBLIMAZE ) injection 50 mcg (50 mcg Intravenous Given 01/20/24 0530)  ondansetron  (ZOFRAN ) injection 4 mg (4 mg Intravenous Given 01/20/24 0532)  fentaNYL  (SUBLIMAZE ) injection 50 mcg (50 mcg Intravenous Given 01/20/24 0558)  labetalol  (NORMODYNE ) injection 10 mg (10 mg Intravenous Given 01/20/24 0559)  ketorolac  (TORADOL ) 15 MG/ML injection 15 mg (15 mg Intravenous Given 01/20/24 0654)  methocarbamol  (ROBAXIN ) tablet 1,000 mg (1,000 mg Oral Given 01/20/24 0653)  prochlorperazine  (COMPAZINE ) injection 5 mg (5 mg Intravenous Given 01/20/24 0654)   Procedures Procedures  (including critical care time) Medical Decision Making / ED Course   Medical Decision Making Amount and/or Complexity of Data Reviewed Labs: ordered. Decision-making details documented in ED Course. Radiology: ordered and independent interpretation performed. Decision-making details documented in ED Course.  Risk Prescription drug management. Parenteral controlled substances. Decision regarding hospitalization.    Sudden onset severe headache noted to be hypertensive.  Differential diagnosis  considered.  Patient given IV pain medicine and labetalol   Given severity of pain with hypertension, subarachnoid hemorrhage/ICH was the initial concern.  CT head was immediately ordered.  CT head within 6 hours of pain onset was negative.  No fevers or leukocytosis concerning for meningitis.  Upon return to the room, patient was reevaluated and stated her pain had improved from 10 to a 7.  She reports associated bilateral jaw pain.  No neck pain, chest pain, back pain.  Reports that she frequently wakes with jaw pain.  Concern for acute arterial process such as carotid dissection is low at this time.  Given her recurrent bilateral jaw discomfort upon awakening, TMJ syndrome is favored.  Additional migraine cocktail given.  Clinical Course as of 01/20/24 0746  Wed Jan 20, 2024  0745 Headache completely resolved. [PC]    Clinical Course User Index [PC] Shakaria Raphael, Raynell Moder, MD    Final Clinical Impression(s) / ED Diagnoses Final diagnoses:  Bad headache  TMJ syndrome   The patient appears reasonably screened and/or stabilized for discharge and I doubt any other medical condition or other [facility] requiring further screening, evaluation, or treatment in the ED at this time. I have discussed the findings, Dx and Tx plan with the patient/family who expressed understanding and agree(s) with the plan. Discharge instructions discussed at length. The patient/family was given strict return precautions who verbalized understanding of the instructions. No further questions at time of discharge.  Disposition: Discharge  Condition: Good  ED Discharge Orders          Ordered    methocarbamol  (ROBAXIN ) 500 MG tablet  At bedtime PRN        01/20/24 0746              Follow Up: Shelda Atlas, MD [address] [place] [address] [phone]  Call  to schedule an appointment for close follow up     This chart was dictated using voice recognition software.  Despite best  efforts to proofread,  errors can occur which can change the documentation meaning.    Trine Raynell Moder, MD 01/20/24 [phone]

## 2024-01-20 NOTE — ED Triage Notes (Signed)
 Pt presents via POV c/o headache. Reports hx of migraines. Reports pain woke her up out of her sleep. Tearful during triage. Reports worst headache she has ever had. A&O x4.

## 2024-01-20 NOTE — ED Notes (Signed)
 Pt given discharge instructions and reviewed prescriptions. Opportunities given for questions. Pt verbalizes understanding. PIV removed x1. Bethena Powell SAUNDERS, RN

## 2024-01-20 NOTE — ED Notes (Signed)
 Cardama MD at bedside to assess.

## 2024-02-29 ENCOUNTER — Encounter: Payer: Self-pay | Admitting: Internal Medicine

## 2024-02-29 DIAGNOSIS — Z1231 Encounter for screening mammogram for malignant neoplasm of breast: Secondary | ICD-10-CM

## 2024-04-05 NOTE — Progress Notes (Signed)
 Chief Complaint  Patient presents with  . Nasal Congestion    Congestion, headache, body aches. Started yesterday, no recorded fevers. Has taken tylenol , last dose was last night.   . Headache    has a past medical history of Allergic, Anxiety, and Pre-diabetes.  Headache  Associated symptoms include coughing and rhinorrhea. Pertinent negatives include no abdominal pain, ear pain, eye pain, eye redness, fever, hearing loss, nausea, sore throat, vomiting or weakness.    [name] is a 53 y.o. presenting to Urgent Care today with 12 hour history of flulike symptoms.  There is no history of asthma.  There is no history of pneumonia.  Patient is currently on no medication and has no medical condition that may reduce immunity or increase the risk for serious infection.  Tobacco Use History[1]  Review of Systems  Constitutional:  Positive for activity change, appetite change, chills and fatigue. Negative for fever.  HENT:  Positive for congestion, postnasal drip and rhinorrhea. Negative for drooling, ear pain, facial swelling, hearing loss and sore throat.   Eyes:  Negative for pain, discharge and redness.  Respiratory:  Positive for cough. Negative for apnea, chest tightness, shortness of breath and wheezing.   Cardiovascular:  Negative for chest pain and leg swelling.  Gastrointestinal:  Negative for abdominal pain, diarrhea, nausea and vomiting.  Musculoskeletal:  Positive for myalgias. Negative for neck stiffness.  Neurological:  Positive for headaches. Negative for weakness.  Psychiatric/Behavioral:  Negative for agitation and confusion.     Review of systems is otherwise negative except as noted in the HPI and Assessment/MDM  Physical Exam   BP 120/86 (BP Location: Left arm, Patient Position: Sitting)   Pulse 98   Temp 98.2 F (36.8 C) (Oral)   Resp 18   Ht 1.626 m (5' 4)   Wt 80.9 kg (178 lb 4.8 oz)   SpO2 98%   BMI 30.61 kg/m     Constitutional:       General: Patient is not in acute respiratory distress.    Appearance: Normal appearance.  Appears fatigued  .  Mucous membranes are moist   HENT:     Head: Normocephalic and atraumatic.     Right Ear: Tympanic membrane normal.     Left Ear: Tympanic membrane normal.     Nose: Nose normal.  No facial tenderness noted over the maxillary sinus   No Swelling or erythema noted.        Mouth: Mucous membranes are moist.  Clear postnasal drainage otherwise pharynx normal.   No anterior cervical lymphadenopathy Eyes:     Conjunctiva/sclera: Conjunctivae normal.     Pupils: Pupils are equal, round, and reactive to light.  Cardiovascular:     Heart sounds: Normal heart sounds. No murmur heard. Pulmonary Lungs clear bilaterally with no wheezing, rales, or rhonchi   Xray Results for this visit  No orders to display    Blood and Point of Care over last 48 hours  No results found for this or any previous visit (from the past 48 hours).   1. Influenza-like illness  promethazine -dextromethorphan (PHENERGAN  DM) 6.25-15 mg/5 mL syrp syrup      This is a 53 y.o.with flulike symptoms as noted above.   Review of patient's vital signs today is reassuring and chest x-ray is not indicated,   and I do not suspect pneumonia     Her symptoms are very mild at this time and I do not feel that any testing is indicated.  She may just have a viral upper respiratory infection.  Will treat as such with Promethazine  DM, rest, hydration, and plan to follow-up for any worsening symptoms or if not improving in 3 days.  Typical isolation precautions were given however and she will stay out of work until she has complete resolution of any fever or chills.   We discussed the need for aggressive hydration, rest, and need for follow-up for any shortness of breath, intractable vomiting, severe headache, neck stiffness, or any other new symptoms.  We discussed risks and side effects of medications, and also  discussed red flags which would warrant immediate follow-up.   Urgent Care Disposition:  Home Care      [1] Social History Tobacco Use  Smoking Status Never  Smokeless Tobacco Never

## 2024-04-07 NOTE — Progress Notes (Signed)
 Discussed with patient/guardian the use of audio recording for this encounter.  Risks, benefits and alternatives (including option to decline recording) were reviewed.  Patient expressed understanding and verbally confirmed consent to proceed with recording.   Subjective  The following information was reviewed by members of the visit team:  Tobacco  Allergies  Meds  Problems  Med Hx  Surg Hx  OB Status   Fam Hx    [name] is a 53 y.o. female who presents for Cough (Pt being coughing since this Tuesday Dec. 2..) History of Present Illness This is a 53 year old female with a history of asthma presenting with a cough.  The patient reports that she has been experiencing a persistent cough for a few days, accompanied by severe chest pain. The pain is described as very intense and is located in the chest area. She does not report any expectoration but suspects congestion, stating she does not know how to spit it out. She has felt feverish and experienced chills but has not measured her temperature. Additionally, she reports a mild shortness of breath. She has a history of asthma and uses an inhaler as needed, although she has not used it during this episode. The cough worsens at night, waking her up and exacerbating her chest pain. She has been taking promethazine , which she believes is helping to break up the congestion, and Tylenol  liquid for symptom relief.  Review of Systems  Constitutional:  Negative for chills and fever.  Respiratory:  Positive for cough and shortness of breath.   Cardiovascular:  Positive for chest pain.  Gastrointestinal:  Negative for abdominal pain, diarrhea, nausea and vomiting.  Genitourinary:  Negative for dysuria.  Neurological:  Negative for dizziness and headaches.    Objective  Blood pressure 121/87, pulse 96, temperature 97.9 F (36.6 C), temperature source Tympanic, resp. rate 20, height 1.626 m (5' 4), weight 81.1 kg (178 lb 12.8 oz), SpO2  98%.  No LMP recorded. Patient is postmenopausal.   Behavioral Health Screening  Patient Health Questionnaire-2 Score: 0 (04/07/2024  8:46 AM)      Patient's Depression screening is Negative   Depression Plan: Normal/Negative Screening  Physical Exam Vitals and nursing note reviewed.  Constitutional:      Appearance: Normal appearance. She is normal weight.  Cardiovascular:     Rate and Rhythm: Normal rate and regular rhythm.     Heart sounds: Normal heart sounds.  Pulmonary:     Effort: Pulmonary effort is normal.     Breath sounds: Decreased breath sounds present. No wheezing, rhonchi or rales.  Skin:    General: Skin is warm and dry.     Capillary Refill: Capillary refill takes 2 to 3 seconds.  Neurological:     General: No focal deficit present.     Mental Status: She is alert and oriented to person, place, and time.  Psychiatric:        Mood and Affect: Mood normal.        Behavior: Behavior normal.     No results found for this or any previous visit (from the past 24 hours).  Radiologist interpretation:    XR Chest 2 Views  Final Result by Eduard Vikki Guppy, MD (12/04 9082)  XR CHEST 2 VIEWS, 04/07/2024 9:13 AM    INDICATION: cough, Acute cough \ R05.1 Acute cough   cough  COMPARISON: None.    FINDINGS:     Cardiovascular: Cardiac silhouette and pulmonary vasculature are within   normal  limits.  Mediastinum: Within normal limits.  Lungs/pleura: Clear. No pleural effusion or pneumothorax.  Upper abdomen: Visualized portions are unremarkable.   Chest wall/osseous structures: Unremarkable.      IMPRESSION:  There is no evidence of acute cardiac or pulmonary abnormality.           Assessment & Plan Initial Assessment: Complaints of severe chest pain, cough, congestion, fever, chills, and mild shortness of breath. History of asthma managed with an inhaler as needed. Taking promethazine  and Tylenol  liquid for symptoms.  Differential Diagnosis: -  Pneumonia: Considered due to chest pain, cough, fever, chills. Chest x-ray clear, no pneumonia. - Asthma exacerbation: History of asthma, mild shortness of breath. Prescribed steroids to reduce inflammation.  ED Course: - Chest x-ray obtained, read by me, results clear, no pneumonia. - Steroids prescribed for inflammation.  Final Assessment: Chest x-ray clear, no pneumonia. Prescribed steroids to reduce inflammation. Continue promethazine , Tylenol , and Motrin . Recommended use of humidifier, rest, hydration, and limited social interactions. Extended leave until 04/10/2024.  Clinical Impression: - Cough - Asthma exacerbation  Disposition: - Follow-Up: Extended leave until 04/10/2024  Patient Education: Rest at home, maintain hydration, use humidifier, avoid social interactions, continue medications.     Attestation    Assessment/Plan   Dondra was seen today for cough.  Diagnoses and all orders for this visit:  Acute cough -     XR Chest 2 Views -     ipratropium-albuteroL (DUO-NEB) 0.5-2.5 mg/3 mL nebulizer solution 3 mL -     dexAMETHasone  (DECADRON ) tablet 8 mg -     dexAMETHasone  (DECADRON ) 6 mg tablet; Take 1 tablet (6 mg total) by mouth daily with breakfast for 5 days.    Patient has been instructed on RX/ OTC medications, dosages, side effects, and possible interactions as associated with each diagnosis in my impression and plan above.  2.   Patient education (verbal/handout) given on diagnosis, pathophysiology, treatment of diagnosis, side effects of medication use for treatment, restrictions while taking medication.  Supportive       Care measures as directed on AVS.  Red Flags associated with diagnosis/es were reviewed and patient instructed on action plan if red flags develop.  3.   Urgent Care Disposition:  Follow up with PCP       They have been instructed that if symptoms worse that should go to Urgent Care, go to the nearest ED, or activate EMS.  4.    Patient agreed with plan and voiced understanding.  NO barriers to adherence perceived by myself.  Electronically signed: Rosaline Jama Collet FNP  Thu 04/07/2024 9:43 AM

## 2024-04-09 ENCOUNTER — Inpatient Hospital Stay (HOSPITAL_COMMUNITY)
Admission: AD | Admit: 2024-04-09 | Discharge: 2024-04-09 | Disposition: A | Discharge status: Home / Self Care | Attending: Obstetrics and Gynecology | Admitting: Obstetrics and Gynecology

## 2024-04-09 DIAGNOSIS — Z789 Other specified health status: Secondary | ICD-10-CM

## 2024-04-09 LAB — POCT PREGNANCY, URINE: Preg Test, Ur: NEGATIVE

## 2024-04-09 NOTE — Progress Notes (Signed)
 "  Discussed with patient/guardian the use of audio recording for this encounter.  Risks, benefits and alternatives (including option to decline recording) were reviewed.  Patient expressed understanding and verbally confirmed consent to proceed with recording.   Subjective  The following information was reviewed by members of the visit team:  Tobacco  Allergies  Meds  Problems  Med Hx  Surg Hx  OB Status   Fam Hx    [name] is a 53 y.o. female who presents for Urine Leakage (Pt states she woke up this morning and was soaking wet, like her water broke. Cleaned herself up and went back to bed. Awoke a second time with same symptoms. Did have pregnancy test and hospital which was negative. ) History of Present Illness This is a 53 year old female with a history of TMJ disorder presenting with urinary incontinence and jaw pain.  The patient reports that last night at around 3:00 AM, she experienced urinary leakage, necessitating a change of her undergarments, gown, and bed sheets. This incident recurred around 6:00 AM. She has been informed that she is not pregnant. She also reports lower abdominal pain and back discomfort. Her urination is more frequent than usual, but she does not report any associated pain or changes in urine color. She has had children and does not report any bladder issues or prolapse.  Additionally, the patient is experiencing jaw pain, which she attributes to TMJ disorder, and reports associated headaches. She has been prescribed methocarbamol  for this condition, which she finds effective.  The patient was previously seen on 04/05/2024 for a persistent cough and chest pain, for which she received treatment and was prescribed steroids. Her cough has since improved, but she continues to experience mild chills.  Review of Systems  Constitutional:  Negative for chills and fever.  Respiratory:  Negative for shortness of breath.   Cardiovascular:  Negative for  chest pain.  Gastrointestinal:  Negative for abdominal pain, diarrhea, nausea and vomiting.  Genitourinary:  Positive for frequency. Negative for decreased urine volume and dysuria.  Musculoskeletal:  Negative for back pain.  Neurological:  Negative for dizziness and headaches.    Objective  Blood pressure (!) 146/94, pulse 98, temperature 98.8 F (37.1 C), temperature source Tympanic, resp. rate 18, weight 80.7 kg (178 lb), SpO2 100%.  No LMP recorded. Patient is postmenopausal.   Behavioral Health Screening  Patient Health Questionnaire-2 Score: 0 (04/07/2024  8:46 AM)      Patient's Depression screening is Negative   Depression Plan: Normal/Negative Screening  Physical Exam Vitals and nursing note reviewed.  Constitutional:      Appearance: Normal appearance. She is normal weight.  Eyes:     Extraocular Movements: Extraocular movements intact.     Conjunctiva/sclera: Conjunctivae normal.     Pupils: Pupils are equal, round, and reactive to light.  Cardiovascular:     Rate and Rhythm: Normal rate and regular rhythm.     Heart sounds: Normal heart sounds.  Pulmonary:     Effort: Pulmonary effort is normal.     Breath sounds: Normal breath sounds.  Abdominal:     General: Bowel sounds are normal.     Palpations: Abdomen is soft.     Tenderness: There is no abdominal tenderness.  Skin:    General: Skin is warm and dry.     Capillary Refill: Capillary refill takes 2 to 3 seconds.  Neurological:     General: No focal deficit present.     Mental Status:  She is alert and oriented to person, place, and time.  Psychiatric:        Mood and Affect: Mood normal.        Behavior: Behavior normal.     Recent Results (from the past 24 hours)  POC Urinalysis Auto without Microscopic   Collection Time: 04/09/24 12:57 PM  Result Value Ref Range   Color, Urine Light Yellow Yellow   Clarity, Urine Clear Clear   Glucose, Urine Negative Negative mg/dL   Bilirubin, Urine  Negative Negative   Ketones, Urine Negative Negative mg/dL   Specific Gravity, Urine <=1.005 (A) 1.010, 1.015, 1.020, 1.025   Blood, Urine Negative Negative   pH, Urine 5.0 5.0, 5.5, 6.0, 6.5, 7.0, 7.5, 8.0   Protein, Urine Negative Negative mg/dL   Urobilinogen, Urine 0.2 <2.0 mg/dL   Nitrite, Urine Negative Negative   Leukocyte Esterase, Urine Negative Negative   Kit/Device Lot # [id number]    Kit/Device Expiration Date 11/01/24   POC Glucose   Collection Time: 04/09/24  1:18 PM  Result Value Ref Range   Glucose, POC 127 (H) 70 - 99 mg/dL    Radiologist interpretation:    No orders to display       Assessment & Plan Initial Assessment: Patient presents with urinary incontinence, chest pain, lower abdominal pain, and TMJ symptoms. Recent history of coughing treated with steroids.  Differential Diagnosis: - Urinary tract infection: Dilute urine, culture sent, antibiotics initiated. - Hyperglycemia: Blood glucose levels normal, unlikely cause. - Overflow incontinence: Possible due to irritated bladder, monitor symptoms.  ED Course: - Blood glucose checked, normal. - Urine sample obtained, dilute. - Urine culture sent. - Antibiotics prescribed. - Methocarbamol  prescription refilled.  Final Assessment: Urinary incontinence evaluated with urine culture and antibiotics prescribed. TMJ symptoms managed with methocarbamol  refill.  Clinical Impression: - Urinary incontinence - Possible urinary tract infection - Temporomandibular joint disorder (TMJ)  Disposition: - Follow-Up: Monitor urine culture results. Consider ultrasound if culture negative.  Patient Education: Discussed potential causes of urinary incontinence. Advised on antibiotic use and monitoring symptoms.     Attestation    Assessment/Plan   [name] was seen today for urine leakage.  Diagnoses and all orders for this visit:  Urinary incontinence, unspecified type -     POC Urinalysis Auto without  Microscopic -     Urine Culture -     sulfamethoxazole-trimethoprim (BACTRIM DS) 800-160 mg per tablet; Take 1 tablet by mouth 2 (two) times a day for 5 days.  TMJ disease -     methocarbamoL  (ROBAXIN ) 500 mg tablet; Take 1 tablet (500 mg total) by mouth 2 (two) times a day as needed for muscle spasms.  Other orders -     POC Glucose    Patient has been instructed on RX/ OTC medications, dosages, side effects, and possible interactions as associated with each diagnosis in my impression and plan above.  2.   Patient education (verbal/handout) given on diagnosis, pathophysiology, treatment of diagnosis, side effects of medication use for treatment, restrictions while taking medication.  Supportive       Care measures as directed on AVS.  Red Flags associated with diagnosis/es were reviewed and patient instructed on action plan if red flags develop.  3.   Urgent Care Disposition:  Follow up with PCP       They have been instructed that if symptoms worse that should go to Urgent Care, go to the nearest ED, or activate EMS.  4.   Patient agreed  with plan and voiced understanding.  NO barriers to adherence perceived by myself.  Electronically signed: Rosaline Jama Collet FNP  Dju 04/09/2024 1:35 PM    "

## 2024-04-09 NOTE — MAU Note (Signed)
 [name] is a 53 y.o. at Unknown here in MAU reporting: woke up and felt like her water broke due to all clothes being wet. States she has had all previous  csections and has never experienced her water breaking before. Reporting lower abdominal pain.   Has not taken a home pregnancy test because her tubes are tied. States she feels bloated at times and has lower back pain. States her appetite is all over the place. Has not taken any medication for pain today.   Patient unable to give urine sample for pregnancy test. Requesting blood work.  LMP: unknown  Onset of complaint: 0300 Pain score: 8 FHT:n/a Lab orders placed from triage:  pregnancy test

## 2024-04-09 NOTE — MAU Note (Cosign Needed)
 Maternal Assessment Unit Provider Note  Subjective: [name] is a 53 y.o. [phone] non-pregnant female who presents to MAU today with complaint of leaking fluid.   Patient has a history of bilateral tubal ligation, but reported to RN she thinks she is pregnant due to waking up in a pool of fluid, and recently feeling bloated with fluctuations in appetite.  Negative urine pregnancy test in triage.  Objective: LMP 02/14/2018  Physical Exam Vitals reviewed.  Constitutional:      General: She is not in acute distress.    Appearance: She is well-developed. She is not diaphoretic.  Eyes:     General: No scleral icterus. Pulmonary:     Effort: Pulmonary effort is normal. No respiratory distress.  Skin:    General: Skin is warm and dry.  Neurological:     Mental Status: She is alert.     Coordination: Coordination normal.     Comments: Ambulates independently    MDM: Low risk  MAU Course: After urine pregnancy test result was negative, I spoke with the patient regarding the MAU is for pregnant or up to 6 weeks postpartum patient.  Discussed her options, if she feels like symptoms are emergent, she can go to the regular [facility] or if she feels like symptoms are not emergent, then can follow up with her OB-Gyn or PCP.  Questions were answered to the satisfaction of the patient and/or family prior to discharge.   Assessment    ICD-10-CM   1. Not currently pregnant  Z78.9        Plan Discharge from MAU in stable condition  Allergies as of 04/09/2024       Reactions   Hydrocodone -acetaminophen  Itching   Vicodin [hydrocodone -acetaminophen ] Itching   Penicillins Rash   Has patient had a PCN reaction causing immediate rash, facial/tongue/throat swelling, SOB or lightheadedness with hypotension: Yes Has patient had a PCN reaction causing severe rash involving mucus membranes or skin necrosis: No Has patient had a PCN reaction that required hospitalization:  No Has patient had a PCN reaction occurring within the last 10 years: No If all of the above answers are NO, then may proceed with Cephalosporin use.        Medication List     STOP taking these medications    diclofenac  sodium 1 % Gel Commonly known as: VOLTAREN    dicyclomine  20 MG tablet Commonly known as: BENTYL    fluticasone  50 MCG/ACT nasal spray Commonly known as: FLONASE    gabapentin  300 MG capsule Commonly known as: Neurontin    ibuprofen  600 MG tablet Commonly known as: ADVIL    loperamide  2 MG capsule Commonly known as: IMODIUM    loratadine  10 MG tablet Commonly known as: CLARITIN    metroNIDAZOLE  500 MG tablet Commonly known as: FLAGYL    ondansetron  4 MG disintegrating tablet Commonly known as: ZOFRAN -ODT       TAKE these medications    albuterol 108 (90 Base) MCG/ACT inhaler Commonly known as: VENTOLIN HFA   amitriptyline 50 MG tablet Commonly known as: ELAVIL Take 50 mg by mouth at bedtime.   azelastine  0.1 % nasal spray Commonly known as: ASTELIN  Place 2 sprays into both nostrils 2 (two) times daily.   cetirizine 10 MG chewable tablet Commonly known as: ZYRTEC Chew 10 mg by mouth daily.   ciprofloxacin  500 MG tablet Commonly known as: CIPRO  Take 1 tablet (500 mg total) by mouth every 12 (twelve) hours.   clonazePAM  0.5 MG tablet Commonly known as: KLONOPIN  Take  0.5 mg by mouth daily as needed for anxiety.   escitalopram 10 MG tablet Commonly known as: LEXAPRO Take 10 mg by mouth daily.   Linzess 145 MCG Caps capsule Generic drug: linaclotide Take 145 mcg by mouth every morning.   meloxicam 15 MG tablet Commonly known as: MOBIC Take 15 mg by mouth daily.   methocarbamol  500 MG tablet Commonly known as: ROBAXIN  Take 1-2 tablets (500-1,000 mg total) by mouth at bedtime as needed for muscle spasms.   methylPREDNISolone  4 MG Tbpk tablet Commonly known as: MEDROL  DOSEPAK Take as directed on package   multivitamin with  minerals Tabs tablet Take 1 tablet by mouth at bedtime.   sucralfate  1 g tablet Commonly known as: Carafate  Take 1 tablet (1 g total) by mouth 4 (four) times daily -  with meals and at bedtime.   zolpidem 10 MG tablet Commonly known as: AMBIEN Take 10 mg by mouth at bedtime.        Trudy Leeroy NOVAK, MD 04/09/2024 10:42 AM

## 2024-04-22 NOTE — Telephone Encounter (Signed)
 Patient is calling requesting an appointment for today.    Patient needs to be seen for: Increased Left knee pain and difficulty standing                       No appointments are available for scheduling until 05/04/2024  Please call patient with availability.  Patient #:  [phone]

## 2024-05-13 ENCOUNTER — Emergency Department (HOSPITAL_BASED_OUTPATIENT_CLINIC_OR_DEPARTMENT_OTHER)
Admission: EM | Admit: 2024-05-13 | Discharge: 2024-05-13 | Discharge status: Against Medical Advice | Attending: Emergency Medicine | Admitting: Emergency Medicine

## 2024-05-13 ENCOUNTER — Encounter (HOSPITAL_BASED_OUTPATIENT_CLINIC_OR_DEPARTMENT_OTHER): Payer: Self-pay

## 2024-05-13 DIAGNOSIS — R112 Nausea with vomiting, unspecified: Secondary | ICD-10-CM | POA: Diagnosis not present

## 2024-05-13 DIAGNOSIS — R109 Unspecified abdominal pain: Secondary | ICD-10-CM | POA: Diagnosis present

## 2024-05-13 DIAGNOSIS — Z5321 Procedure and treatment not carried out due to patient leaving prior to being seen by health care provider: Secondary | ICD-10-CM | POA: Insufficient documentation

## 2024-05-13 LAB — CBC WITH DIFFERENTIAL/PLATELET
Abs Immature Granulocytes: 0.01 K/uL (ref 0.00–0.07)
Basophils Absolute: 0 K/uL (ref 0.0–0.1)
Basophils Relative: 0 %
Eosinophils Absolute: 0.1 K/uL (ref 0.0–0.5)
Eosinophils Relative: 2 %
HCT: 36.6 % (ref 36.0–46.0)
Hemoglobin: 12.8 g/dL (ref 12.0–15.0)
Immature Granulocytes: 0 %
Lymphocytes Relative: 52 %
Lymphs Abs: 3.2 K/uL (ref 0.7–4.0)
MCH: 31.1 pg (ref 26.0–34.0)
MCHC: 35 g/dL (ref 30.0–36.0)
MCV: 88.8 fL (ref 80.0–100.0)
Monocytes Absolute: 0.6 K/uL (ref 0.1–1.0)
Monocytes Relative: 10 %
Neutro Abs: 2.2 K/uL (ref 1.7–7.7)
Neutrophils Relative %: 36 %
Platelets: 355 K/uL (ref 150–400)
RBC: 4.12 MIL/uL (ref 3.87–5.11)
RDW: 12.4 % (ref 11.5–15.5)
WBC: 6.1 K/uL (ref 4.0–10.5)
nRBC: 0 % (ref 0.0–0.2)

## 2024-05-13 LAB — URINALYSIS, ROUTINE W REFLEX MICROSCOPIC
Bilirubin Urine: NEGATIVE
Glucose, UA: NEGATIVE mg/dL
Ketones, ur: NEGATIVE mg/dL
Nitrite: NEGATIVE
Protein, ur: 30 mg/dL — AB
Specific Gravity, Urine: 1.033 — ABNORMAL HIGH (ref 1.005–1.030)
Squamous Epithelial / HPF: >50 /HPF (ref 0–5)
Trans Epithel, UA: 1
pH: 5.5 (ref 5.0–8.0)

## 2024-05-13 LAB — COMPREHENSIVE METABOLIC PANEL WITH GFR
ALT: 26 U/L (ref 0–44)
AST: 21 U/L (ref 15–41)
Albumin: 4.6 g/dL (ref 3.5–5.0)
Alkaline Phosphatase: 104 U/L (ref 38–126)
Anion gap: 14 (ref 5–15)
BUN: 10 mg/dL (ref 6–20)
CO2: 22 mmol/L (ref 22–32)
Calcium: 10.6 mg/dL — ABNORMAL HIGH (ref 8.9–10.3)
Chloride: 105 mmol/L (ref 98–111)
Creatinine, Ser: 1.45 mg/dL — ABNORMAL HIGH (ref 0.44–1.00)
GFR, Estimated: 43 mL/min — ABNORMAL LOW
Glucose, Bld: 84 mg/dL (ref 70–99)
Potassium: 3.6 mmol/L (ref 3.5–5.1)
Sodium: 141 mmol/L (ref 135–145)
Total Bilirubin: 0.3 mg/dL (ref 0.0–1.2)
Total Protein: 7.3 g/dL (ref 6.5–8.1)

## 2024-05-13 LAB — PREGNANCY, URINE: Preg Test, Ur: NEGATIVE

## 2024-05-13 NOTE — ED Triage Notes (Signed)
 Pt states she is having an IBS flare Started tonight N/V and cramping pain

## 2024-05-13 NOTE — ED Notes (Signed)
 Pt called times 3 by Brittany, RN with no answer in lobby

## 2024-06-14 ENCOUNTER — Ambulatory Visit: Payer: Self-pay
